# Patient Record
Sex: Female | Born: 1970 | Race: White | Hispanic: No | Marital: Married | Smoking: Never smoker
Health system: Southern US, Community
[De-identification: ages and names within clinical notes are randomized; demographics above are authoritative.]

## PROBLEM LIST (undated history)

## (undated) DIAGNOSIS — N6099 Unspecified benign mammary dysplasia of unspecified breast: Secondary | ICD-10-CM

## (undated) DIAGNOSIS — M1990 Unspecified osteoarthritis, unspecified site: Secondary | ICD-10-CM

## (undated) DIAGNOSIS — G44009 Cluster headache syndrome, unspecified, not intractable: Secondary | ICD-10-CM

## (undated) HISTORY — DX: Cluster headache syndrome, unspecified, not intractable: G44.009

## (undated) HISTORY — PX: KNEE ARTHROSCOPY: SUR90

## (undated) HISTORY — PX: RHINOPLASTY: SUR1284

---

## 1998-04-24 ENCOUNTER — Other Ambulatory Visit: Admission: RE | Admit: 1998-04-24 | Discharge: 1998-04-24 | Payer: Self-pay | Admitting: Obstetrics and Gynecology

## 1999-01-06 ENCOUNTER — Ambulatory Visit (HOSPITAL_COMMUNITY): Admission: RE | Admit: 1999-01-06 | Discharge: 1999-01-06 | Payer: Self-pay | Admitting: Obstetrics & Gynecology

## 1999-04-04 ENCOUNTER — Inpatient Hospital Stay (HOSPITAL_COMMUNITY): Admission: AD | Admit: 1999-04-04 | Discharge: 1999-04-06 | Payer: Self-pay | Admitting: Obstetrics and Gynecology

## 1999-05-15 ENCOUNTER — Other Ambulatory Visit: Admission: RE | Admit: 1999-05-15 | Discharge: 1999-05-15 | Payer: Self-pay | Admitting: Obstetrics and Gynecology

## 2000-08-02 ENCOUNTER — Other Ambulatory Visit: Admission: RE | Admit: 2000-08-02 | Discharge: 2000-08-02 | Payer: Self-pay | Admitting: Obstetrics and Gynecology

## 2001-06-19 ENCOUNTER — Encounter: Admission: RE | Admit: 2001-06-19 | Discharge: 2001-06-19 | Payer: Self-pay | Admitting: Family Medicine

## 2001-06-19 ENCOUNTER — Encounter: Payer: Self-pay | Admitting: Family Medicine

## 2001-09-07 ENCOUNTER — Other Ambulatory Visit: Admission: RE | Admit: 2001-09-07 | Discharge: 2001-09-07 | Payer: Self-pay | Admitting: Obstetrics and Gynecology

## 2002-10-03 ENCOUNTER — Other Ambulatory Visit: Admission: RE | Admit: 2002-10-03 | Discharge: 2002-10-03 | Payer: Self-pay | Admitting: Obstetrics and Gynecology

## 2003-03-01 ENCOUNTER — Encounter: Payer: Self-pay | Admitting: Family Medicine

## 2003-03-01 ENCOUNTER — Encounter: Admission: RE | Admit: 2003-03-01 | Discharge: 2003-03-01 | Payer: Self-pay | Admitting: Family Medicine

## 2003-11-07 ENCOUNTER — Other Ambulatory Visit: Admission: RE | Admit: 2003-11-07 | Discharge: 2003-11-07 | Payer: Self-pay | Admitting: Obstetrics and Gynecology

## 2005-05-28 ENCOUNTER — Other Ambulatory Visit: Admission: RE | Admit: 2005-05-28 | Discharge: 2005-05-28 | Payer: Self-pay | Admitting: Obstetrics and Gynecology

## 2007-08-29 ENCOUNTER — Other Ambulatory Visit: Admission: RE | Admit: 2007-08-29 | Discharge: 2007-08-29 | Payer: Self-pay | Admitting: Obstetrics and Gynecology

## 2009-09-09 ENCOUNTER — Other Ambulatory Visit: Admission: RE | Admit: 2009-09-09 | Discharge: 2009-09-09 | Payer: Self-pay | Admitting: Obstetrics and Gynecology

## 2010-03-10 ENCOUNTER — Encounter: Admission: RE | Admit: 2010-03-10 | Discharge: 2010-03-10 | Payer: Self-pay | Admitting: Family Medicine

## 2010-06-04 ENCOUNTER — Other Ambulatory Visit: Admission: RE | Admit: 2010-06-04 | Discharge: 2010-06-04 | Payer: Self-pay | Admitting: Obstetrics and Gynecology

## 2010-12-03 ENCOUNTER — Other Ambulatory Visit (HOSPITAL_COMMUNITY)
Admission: RE | Admit: 2010-12-03 | Discharge: 2010-12-03 | Disposition: A | Discharge status: Home / Self Care | Payer: PRIVATE HEALTH INSURANCE | Source: Ambulatory Visit | Attending: Obstetrics and Gynecology | Admitting: Obstetrics and Gynecology

## 2010-12-03 ENCOUNTER — Other Ambulatory Visit: Payer: Self-pay | Admitting: Nurse Practitioner

## 2010-12-03 DIAGNOSIS — Z01419 Encounter for gynecological examination (general) (routine) without abnormal findings: Secondary | ICD-10-CM | POA: Insufficient documentation

## 2011-12-16 ENCOUNTER — Other Ambulatory Visit: Payer: Self-pay | Admitting: Nurse Practitioner

## 2011-12-16 ENCOUNTER — Other Ambulatory Visit (HOSPITAL_COMMUNITY)
Admission: RE | Admit: 2011-12-16 | Discharge: 2011-12-16 | Disposition: A | Discharge status: Home / Self Care | Payer: PRIVATE HEALTH INSURANCE | Source: Ambulatory Visit | Attending: Obstetrics and Gynecology | Admitting: Obstetrics and Gynecology

## 2011-12-16 DIAGNOSIS — Z01419 Encounter for gynecological examination (general) (routine) without abnormal findings: Secondary | ICD-10-CM | POA: Insufficient documentation

## 2012-03-23 ENCOUNTER — Other Ambulatory Visit: Payer: Self-pay | Admitting: Family Medicine

## 2012-03-23 DIAGNOSIS — N644 Mastodynia: Secondary | ICD-10-CM

## 2012-04-03 ENCOUNTER — Other Ambulatory Visit: Payer: Self-pay | Admitting: Family Medicine

## 2012-04-03 ENCOUNTER — Ambulatory Visit
Admission: RE | Admit: 2012-04-03 | Discharge: 2012-04-03 | Disposition: A | Discharge status: Home / Self Care | Payer: PRIVATE HEALTH INSURANCE | Source: Ambulatory Visit | Attending: Family Medicine | Admitting: Family Medicine

## 2012-04-03 DIAGNOSIS — N644 Mastodynia: Secondary | ICD-10-CM

## 2012-06-19 HISTORY — PX: BREAST BIOPSY: SHX20

## 2012-07-02 HISTORY — PX: BREAST EXCISIONAL BIOPSY: SUR124

## 2013-01-18 ENCOUNTER — Other Ambulatory Visit: Payer: Self-pay

## 2013-01-18 DIAGNOSIS — Z1231 Encounter for screening mammogram for malignant neoplasm of breast: Secondary | ICD-10-CM

## 2013-02-01 ENCOUNTER — Other Ambulatory Visit: Payer: Self-pay | Admitting: Nurse Practitioner

## 2013-02-01 ENCOUNTER — Other Ambulatory Visit (HOSPITAL_COMMUNITY)
Admission: RE | Admit: 2013-02-01 | Discharge: 2013-02-01 | Disposition: A | Discharge status: Home / Self Care | Payer: BC Managed Care – PPO | Source: Ambulatory Visit | Attending: Obstetrics and Gynecology | Admitting: Obstetrics and Gynecology

## 2013-02-01 DIAGNOSIS — Z01419 Encounter for gynecological examination (general) (routine) without abnormal findings: Secondary | ICD-10-CM | POA: Insufficient documentation

## 2013-04-04 ENCOUNTER — Ambulatory Visit: Payer: PRIVATE HEALTH INSURANCE

## 2013-04-30 ENCOUNTER — Ambulatory Visit: Payer: PRIVATE HEALTH INSURANCE

## 2013-04-30 ENCOUNTER — Ambulatory Visit
Admission: RE | Admit: 2013-04-30 | Discharge: 2013-04-30 | Disposition: A | Discharge status: Home / Self Care | Payer: BC Managed Care – PPO | Source: Ambulatory Visit

## 2013-04-30 DIAGNOSIS — Z1231 Encounter for screening mammogram for malignant neoplasm of breast: Secondary | ICD-10-CM

## 2014-04-11 ENCOUNTER — Other Ambulatory Visit: Payer: Self-pay

## 2014-04-11 DIAGNOSIS — Z1231 Encounter for screening mammogram for malignant neoplasm of breast: Secondary | ICD-10-CM

## 2014-05-01 ENCOUNTER — Ambulatory Visit
Admission: RE | Admit: 2014-05-01 | Discharge: 2014-05-01 | Disposition: A | Discharge status: Home / Self Care | Payer: BC Managed Care – PPO | Source: Ambulatory Visit

## 2014-05-01 DIAGNOSIS — Z1231 Encounter for screening mammogram for malignant neoplasm of breast: Secondary | ICD-10-CM

## 2015-05-28 ENCOUNTER — Other Ambulatory Visit: Payer: Self-pay

## 2015-05-28 DIAGNOSIS — Z1231 Encounter for screening mammogram for malignant neoplasm of breast: Secondary | ICD-10-CM

## 2015-06-02 ENCOUNTER — Ambulatory Visit
Admission: RE | Admit: 2015-06-02 | Discharge: 2015-06-02 | Disposition: A | Discharge status: Home / Self Care | Payer: BLUE CROSS/BLUE SHIELD | Source: Ambulatory Visit

## 2015-06-02 DIAGNOSIS — Z1231 Encounter for screening mammogram for malignant neoplasm of breast: Secondary | ICD-10-CM

## 2015-06-03 ENCOUNTER — Other Ambulatory Visit: Payer: Self-pay | Admitting: Family Medicine

## 2015-06-03 DIAGNOSIS — R928 Other abnormal and inconclusive findings on diagnostic imaging of breast: Secondary | ICD-10-CM

## 2015-06-06 ENCOUNTER — Other Ambulatory Visit: Payer: Self-pay | Admitting: Family Medicine

## 2015-06-06 ENCOUNTER — Ambulatory Visit
Admission: RE | Admit: 2015-06-06 | Discharge: 2015-06-06 | Disposition: A | Discharge status: Home / Self Care | Payer: BLUE CROSS/BLUE SHIELD | Source: Ambulatory Visit | Attending: Family Medicine | Admitting: Family Medicine

## 2015-06-06 DIAGNOSIS — R928 Other abnormal and inconclusive findings on diagnostic imaging of breast: Secondary | ICD-10-CM

## 2015-06-06 DIAGNOSIS — R921 Mammographic calcification found on diagnostic imaging of breast: Secondary | ICD-10-CM

## 2015-06-10 ENCOUNTER — Other Ambulatory Visit: Payer: Self-pay | Admitting: Family Medicine

## 2015-06-10 ENCOUNTER — Ambulatory Visit
Admission: RE | Admit: 2015-06-10 | Discharge: 2015-06-10 | Disposition: A | Discharge status: Home / Self Care | Payer: BLUE CROSS/BLUE SHIELD | Source: Ambulatory Visit | Attending: Family Medicine | Admitting: Family Medicine

## 2015-06-10 DIAGNOSIS — R921 Mammographic calcification found on diagnostic imaging of breast: Secondary | ICD-10-CM

## 2015-06-27 ENCOUNTER — Other Ambulatory Visit: Payer: Self-pay | Admitting: General Surgery

## 2015-06-27 DIAGNOSIS — N6091 Unspecified benign mammary dysplasia of right breast: Secondary | ICD-10-CM

## 2015-07-01 ENCOUNTER — Other Ambulatory Visit: Payer: Self-pay | Admitting: General Surgery

## 2015-07-01 DIAGNOSIS — N6091 Unspecified benign mammary dysplasia of right breast: Secondary | ICD-10-CM

## 2015-07-09 ENCOUNTER — Encounter (HOSPITAL_BASED_OUTPATIENT_CLINIC_OR_DEPARTMENT_OTHER): Payer: Self-pay | Admitting: *Deleted

## 2015-07-14 ENCOUNTER — Ambulatory Visit
Admission: RE | Admit: 2015-07-14 | Discharge: 2015-07-14 | Disposition: A | Discharge status: Home / Self Care | Payer: BLUE CROSS/BLUE SHIELD | Source: Ambulatory Visit | Attending: General Surgery | Admitting: General Surgery

## 2015-07-14 DIAGNOSIS — N6091 Unspecified benign mammary dysplasia of right breast: Secondary | ICD-10-CM

## 2015-07-14 NOTE — H&P (Signed)
Alexandra Carlson  Location: [facility] Patient #: [id number] DOB: Jul 11, 1971 Married / Language: English / Race: White Female        History of Present Illness   The patient is a 44 year old female who presents with a breast mass. 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 Dr. Marisue Humble is her PCP. She is referred by Dr. Luberta Robertson at the breast center [facility] for evaluation and management of an area of atypical ductal hyperplasia right breast, 8 o'clock position. She began getting screening mammograms at age 37. Recent screening mammogram show an area of clustered calcifications in the right breast, lower outer quadrant, 8 o'clock position, mid to posterior depth, 1.5 cm diameter. 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 This was read out by Dr. Claudette Laws. I have reviewed the imaging studies and the pathology report to her. I have advised to have this area conservatively excised and she completely agrees. Family history is negative for breast or ovarian cancer. Mother has hyperlipidemia and hypertension. Father has congestive heart failure. Patient is married with one child. 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 Denies tobacco.  I discussed the indications, details, techniques, and numerous risk of right breast lumpectomy with radioactive seed localization. I told her that would treat this like a in situ cancer and try to get a small margin. She is aware the risk of bleeding, infection, nerve damage with chronic pain or numbness, cosmetic deformity, reoperation for complications or for positive margins, and other unforeseen problems. She knows that if this turns out to be cancer that further treatment options will need to be explored. She understands all these issues. All of her questions are answered. She agrees with this  plan.   Other Problems Arthritis General anesthesia - complications Migraine Headache  Past Surgical History  Knee Surgery Right. Oral Surgery  Diagnostic Studies History  Colonoscopy never Mammogram within last year Pap Smear 1-5 years ago  Allergies No Known Drug Allergies08/26/2016  Medication History  Enbrel SureClick (50MG /ML Soln Auto-inj, Subcutaneous) Active. Norgestim-Eth Estrad Triphasic (0.18/0.215/0.25MG -35 MCG Tablet, Oral) Active. Mobic (15MG  Tablet, Oral) Active. Medications Reconciled  Social History  Caffeine use Carbonated beverages, Tea. No alcohol use No drug use Tobacco use Never smoker.  Family History  Heart Disease Brother, Father. Heart disease in female family member before age 40 Heart disease in female family member before age 58 Hypertension Mother. Ischemic Bowel Disease Mother. Kidney Disease Father. Migraine Headache Mother. Seizure disorder Brother.  Pregnancy / Birth History  Age at menarche 33 years. Contraceptive History Oral contraceptives. Gravida 2 Maternal age 38-30 Para 1 Regular periods  Review of Systems  General Not Present- Appetite Loss, Chills, Fatigue, Fever, Night Sweats, Weight Gain and Weight Loss. HEENT Not Present- Earache, Hearing Loss, Hoarseness, Nose Bleed, Oral Ulcers, Ringing in the Ears, Seasonal Allergies, Sinus Pain, Sore Throat, Visual Disturbances, Wears glasses/contact lenses and Yellow Eyes. Respiratory Not Present- Bloody sputum, Chronic Cough, Difficulty Breathing, Snoring and Wheezing. Breast Present- Breast Mass and Nipple Discharge. Not Present- Breast Pain and Skin Changes. Cardiovascular Not Present- Chest Pain, Difficulty Breathing Lying Down, Leg Cramps, Palpitations, Rapid Heart Rate, Shortness of Breath and Swelling of Extremities. Gastrointestinal Not Present- Abdominal Pain, Bloating, Bloody Stool, Change in Bowel Habits, Chronic diarrhea, Constipation,  Difficulty Swallowing, Excessive gas, Gets full quickly at meals, Hemorrhoids, Indigestion, Nausea, Rectal Pain and Vomiting. Female Genitourinary Not  Present- Frequency, Nocturia, Painful Urination, Pelvic Pain and Urgency. Musculoskeletal Present- Joint Pain. Not Present- Back Pain, Joint Stiffness, Muscle Pain, Muscle Weakness and Swelling of Extremities. Neurological Not Present- Decreased Memory, Fainting, Headaches, Numbness, Seizures, Tingling, Tremor, Trouble walking and Weakness. Psychiatric Not Present- Anxiety, Bipolar, Change in Sleep Pattern, Depression, Fearful and Frequent crying. Endocrine Not Present- Cold Intolerance, Excessive Hunger, Hair Changes, Heat Intolerance, Hot flashes and New Diabetes. Hematology Not Present- Easy Bruising, Excessive bleeding, Gland problems, HIV and Persistent Infections.   Vitals  Weight: 170 lb Height: 66in Body Surface Area: 1.89 m Body Mass Index: 27.44 kg/m Temp.: 45F(Temporal)  Pulse: 109 (Regular)  BP: 124/80 (Sitting, Left Arm, Standard)    Physical Exam  General Mental Status-Alert. General Appearance-Not in acute distress. Build & Nutrition-Well nourished. Posture-Normal posture. Gait-Normal.  Head and Neck Head-normocephalic, atraumatic with no lesions or palpable masses. Trachea-midline. Thyroid Gland Characteristics - normal size and consistency and no palpable nodules.  Chest and Lung Exam Chest and lung exam reveals -on auscultation, normal breath sounds, no adventitious sounds and normal vocal resonance.  Breast Note: Recent biopsy puncture site right breast at 6:00. No hematoma. No palpable mass in either breast. Nipple and areola complex is normal. No axillary adenopathy. Left breast exam is unremarkable.   Cardiovascular Cardiovascular examination reveals -normal heart sounds, regular rate and rhythm with no murmurs and femoral artery auscultation bilaterally reveals normal  pulses, no bruits, no thrills.  Abdomen Inspection Inspection of the abdomen reveals - No Hernias. Palpation/Percussion Palpation and Percussion of the abdomen reveal - Soft, Non Tender, No Rigidity (guarding), No hepatosplenomegaly and No Palpable abdominal masses.  Neurologic Neurologic evaluation reveals -alert and oriented x 3 with no impairment of recent or remote memory, normal attention span and ability to concentrate, normal sensation and normal coordination.  Musculoskeletal Normal Exam - Bilateral-Upper Extremity Strength Normal and Lower Extremity Strength Normal.    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 The pathologist says that he is concerned there may be borderline ductal carcinoma in situ. This area should be conservatively excised to clarify your diagnosis. I have discussed the indications, techniques, and numerous risk of right breast lumpectomy with radioactive seed localization Please read the printed information that I gave you We will schedule the surgery for you in the near future.  Pt Education - [facility] Breast Biopsy HCI: discussed with patient and provided information. Pt Education - [facility] Breast Biopsy Instructions (FLB): discussed with patient and provided information.   PSORIATIC ARTHRITIS (696.0  L40.50)   Edsel Petrin. Dalbert Batman, M.D., [facility] General and Minimally invasive Surgery Breast and Colorectal Surgery Office:   [phone] Pager:   [phone]

## 2015-07-15 ENCOUNTER — Ambulatory Visit (HOSPITAL_BASED_OUTPATIENT_CLINIC_OR_DEPARTMENT_OTHER): Payer: BLUE CROSS/BLUE SHIELD | Admitting: Anesthesiology

## 2015-07-15 ENCOUNTER — Encounter (HOSPITAL_BASED_OUTPATIENT_CLINIC_OR_DEPARTMENT_OTHER): Payer: Self-pay | Admitting: Anesthesiology

## 2015-07-15 ENCOUNTER — Ambulatory Visit (HOSPITAL_BASED_OUTPATIENT_CLINIC_OR_DEPARTMENT_OTHER)
Admission: RE | Admit: 2015-07-15 | Discharge: 2015-07-15 | Disposition: A | Discharge status: Home / Self Care | Payer: BLUE CROSS/BLUE SHIELD | Source: Ambulatory Visit | Attending: General Surgery | Admitting: General Surgery

## 2015-07-15 ENCOUNTER — Ambulatory Visit
Admission: RE | Admit: 2015-07-15 | Discharge: 2015-07-15 | Disposition: A | Discharge status: Home / Self Care | Payer: BLUE CROSS/BLUE SHIELD | Source: Ambulatory Visit | Attending: General Surgery | Admitting: General Surgery

## 2015-07-15 ENCOUNTER — Encounter (HOSPITAL_BASED_OUTPATIENT_CLINIC_OR_DEPARTMENT_OTHER)
Admission: RE | Disposition: A | Discharge status: Home / Self Care | Payer: Self-pay | Source: Ambulatory Visit | Attending: General Surgery

## 2015-07-15 DIAGNOSIS — M1990 Unspecified osteoarthritis, unspecified site: Secondary | ICD-10-CM | POA: Diagnosis not present

## 2015-07-15 DIAGNOSIS — D241 Benign neoplasm of right breast: Secondary | ICD-10-CM | POA: Insufficient documentation

## 2015-07-15 DIAGNOSIS — N6091 Unspecified benign mammary dysplasia of right breast: Secondary | ICD-10-CM | POA: Insufficient documentation

## 2015-07-15 DIAGNOSIS — N6011 Diffuse cystic mastopathy of right breast: Secondary | ICD-10-CM | POA: Diagnosis not present

## 2015-07-15 DIAGNOSIS — Z885 Allergy status to narcotic agent status: Secondary | ICD-10-CM | POA: Insufficient documentation

## 2015-07-15 DIAGNOSIS — L4050 Arthropathic psoriasis, unspecified: Secondary | ICD-10-CM | POA: Insufficient documentation

## 2015-07-15 HISTORY — DX: Unspecified osteoarthritis, unspecified site: M19.90

## 2015-07-15 HISTORY — PX: BREAST LUMPECTOMY WITH RADIOACTIVE SEED LOCALIZATION: SHX6424

## 2015-07-15 HISTORY — DX: Unspecified benign mammary dysplasia of unspecified breast: N60.99

## 2015-07-15 LAB — POCT HEMOGLOBIN-HEMACUE: HEMOGLOBIN: 15 g/dL (ref 12.0–15.0)

## 2015-07-15 SURGERY — BREAST LUMPECTOMY WITH RADIOACTIVE SEED LOCALIZATION
Anesthesia: General | Site: Breast | Laterality: Right

## 2015-07-15 MED ORDER — SCOPOLAMINE 1 MG/3DAYS TD PT72
MEDICATED_PATCH | TRANSDERMAL | Status: AC
  Filled 2015-07-15: qty 1

## 2015-07-15 MED ORDER — CEFAZOLIN SODIUM-DEXTROSE 2-3 GM-% IV SOLR
2.0000 g | INTRAVENOUS | Status: AC
  Administered 2015-07-15: 2 g via INTRAVENOUS

## 2015-07-15 MED ORDER — MEPERIDINE HCL 25 MG/ML IJ SOLN: 6.2500 mg | INTRAMUSCULAR | Status: DC | PRN

## 2015-07-15 MED ORDER — SCOPOLAMINE 1 MG/3DAYS TD PT72
1.0000 | MEDICATED_PATCH | Freq: Once | TRANSDERMAL | Status: DC | PRN
  Administered 2015-07-15: 1.5 mg via TRANSDERMAL

## 2015-07-15 MED ORDER — HYDROMORPHONE HCL 1 MG/ML IJ SOLN: 0.2500 mg | INTRAMUSCULAR | Status: DC | PRN

## 2015-07-15 MED ORDER — CEFAZOLIN SODIUM-DEXTROSE 2-3 GM-% IV SOLR
INTRAVENOUS | Status: AC
  Filled 2015-07-15: qty 50

## 2015-07-15 MED ORDER — BUPIVACAINE-EPINEPHRINE (PF) 0.5% -1:200000 IJ SOLN
INTRAMUSCULAR | Status: DC | PRN
  Administered 2015-07-15: 8 mL
  Administered 2015-07-15: 9 mL

## 2015-07-15 MED ORDER — OXYCODONE HCL 5 MG/5ML PO SOLN: 5.0000 mg | Freq: Once | ORAL | Status: DC | PRN

## 2015-07-15 MED ORDER — DEXAMETHASONE SODIUM PHOSPHATE 4 MG/ML IJ SOLN
INTRAMUSCULAR | Status: DC | PRN
  Administered 2015-07-15: 10 mg via INTRAVENOUS

## 2015-07-15 MED ORDER — PROPOFOL 10 MG/ML IV BOLUS
INTRAVENOUS | Status: AC
  Filled 2015-07-15: qty 20

## 2015-07-15 MED ORDER — OXYCODONE HCL 5 MG PO TABS: 5.0000 mg | ORAL_TABLET | Freq: Once | ORAL | Status: DC | PRN

## 2015-07-15 MED ORDER — HYDROCODONE-ACETAMINOPHEN 5-325 MG PO TABS: 1.0000 | ORAL_TABLET | Freq: Four times a day (QID) | ORAL | Status: DC | PRN

## 2015-07-15 MED ORDER — MIDAZOLAM HCL 5 MG/5ML IJ SOLN
INTRAMUSCULAR | Status: DC | PRN
  Administered 2015-07-15: 2 mg via INTRAVENOUS

## 2015-07-15 MED ORDER — FENTANYL CITRATE (PF) 100 MCG/2ML IJ SOLN
INTRAMUSCULAR | Status: AC
  Filled 2015-07-15: qty 4

## 2015-07-15 MED ORDER — ONDANSETRON HCL 4 MG/2ML IJ SOLN
INTRAMUSCULAR | Status: AC
  Filled 2015-07-15: qty 2

## 2015-07-15 MED ORDER — PROPOFOL 10 MG/ML IV BOLUS
INTRAVENOUS | Status: DC | PRN
  Administered 2015-07-15 (×2): 30 mg via INTRAVENOUS
  Administered 2015-07-15: 140 mg via INTRAVENOUS

## 2015-07-15 MED ORDER — LACTATED RINGERS IV SOLN
INTRAVENOUS | Status: DC
  Administered 2015-07-15 (×2): via INTRAVENOUS

## 2015-07-15 MED ORDER — LIDOCAINE HCL (CARDIAC) 20 MG/ML IV SOLN
INTRAVENOUS | Status: DC | PRN
  Administered 2015-07-15: 50 mg via INTRAVENOUS

## 2015-07-15 MED ORDER — FENTANYL CITRATE (PF) 100 MCG/2ML IJ SOLN
INTRAMUSCULAR | Status: DC | PRN
  Administered 2015-07-15: 50 ug via INTRAVENOUS
  Administered 2015-07-15: 100 ug via INTRAVENOUS

## 2015-07-15 MED ORDER — LIDOCAINE HCL (CARDIAC) 20 MG/ML IV SOLN
INTRAVENOUS | Status: AC
  Filled 2015-07-15: qty 5

## 2015-07-15 MED ORDER — FENTANYL CITRATE (PF) 100 MCG/2ML IJ SOLN: 50.0000 ug | INTRAMUSCULAR | Status: DC | PRN

## 2015-07-15 MED ORDER — GLYCOPYRROLATE 0.2 MG/ML IJ SOLN: 0.2000 mg | Freq: Once | INTRAMUSCULAR | Status: DC | PRN

## 2015-07-15 MED ORDER — MIDAZOLAM HCL 2 MG/2ML IJ SOLN
INTRAMUSCULAR | Status: AC
  Filled 2015-07-15: qty 4

## 2015-07-15 MED ORDER — ONDANSETRON HCL 4 MG/2ML IJ SOLN
INTRAMUSCULAR | Status: DC | PRN
  Administered 2015-07-15: 4 mg via INTRAVENOUS

## 2015-07-15 MED ORDER — CHLORHEXIDINE GLUCONATE 4 % EX LIQD: 1.0000 "application " | Freq: Once | CUTANEOUS | Status: DC

## 2015-07-15 MED ORDER — MIDAZOLAM HCL 2 MG/2ML IJ SOLN: 1.0000 mg | INTRAMUSCULAR | Status: DC | PRN

## 2015-07-15 MED ORDER — DEXAMETHASONE SODIUM PHOSPHATE 10 MG/ML IJ SOLN
INTRAMUSCULAR | Status: AC
  Filled 2015-07-15: qty 1

## 2015-07-15 SURGICAL SUPPLY — 65 items
ADH SKN CLS APL DERMABOND .7 (GAUZE/BANDAGES/DRESSINGS) ×1
APL SKNCLS STERI-STRIP NONHPOA (GAUZE/BANDAGES/DRESSINGS)
APPLIER CLIP 9.375 MED OPEN (MISCELLANEOUS)
APR CLP MED 9.3 20 MLT OPN (MISCELLANEOUS)
BENZOIN TINCTURE PRP APPL 2/3 (GAUZE/BANDAGES/DRESSINGS) IMPLANT
BINDER BREAST LRG (GAUZE/BANDAGES/DRESSINGS) IMPLANT
BINDER BREAST MEDIUM (GAUZE/BANDAGES/DRESSINGS) IMPLANT
BINDER BREAST XLRG (GAUZE/BANDAGES/DRESSINGS) IMPLANT
BINDER BREAST XXLRG (GAUZE/BANDAGES/DRESSINGS) IMPLANT
BLADE HEX COATED 2.75 (ELECTRODE) ×3 IMPLANT
BLADE SURG 10 STRL SS (BLADE) IMPLANT
BLADE SURG 15 STRL LF DISP TIS (BLADE) ×1 IMPLANT
BLADE SURG 15 STRL SS (BLADE) ×3
CANISTER SUC SOCK COL 7IN (MISCELLANEOUS) IMPLANT
CANISTER SUCT 1200ML W/VALVE (MISCELLANEOUS) ×3 IMPLANT
CHLORAPREP W/TINT 26ML (MISCELLANEOUS) ×3 IMPLANT
CLIP APPLIE 9.375 MED OPEN (MISCELLANEOUS) IMPLANT
CLOSURE WOUND 1/2 X4 (GAUZE/BANDAGES/DRESSINGS)
COVER BACK TABLE 60X90IN (DRAPES) ×3 IMPLANT
COVER MAYO STAND STRL (DRAPES) ×3 IMPLANT
COVER PROBE W GEL 5X96 (DRAPES) ×3 IMPLANT
DECANTER SPIKE VIAL GLASS SM (MISCELLANEOUS) IMPLANT
DERMABOND ADVANCED (GAUZE/BANDAGES/DRESSINGS) ×2
DERMABOND ADVANCED .7 DNX12 (GAUZE/BANDAGES/DRESSINGS) ×1 IMPLANT
DEVICE DUBIN W/COMP PLATE 8390 (MISCELLANEOUS) ×3 IMPLANT
DRAPE LAPAROSCOPIC ABDOMINAL (DRAPES) ×3 IMPLANT
DRAPE UTILITY XL STRL (DRAPES) ×3 IMPLANT
DRSG PAD ABDOMINAL 8X10 ST (GAUZE/BANDAGES/DRESSINGS) IMPLANT
ELECT REM PT RETURN 9FT ADLT (ELECTROSURGICAL) ×3
ELECTRODE REM PT RTRN 9FT ADLT (ELECTROSURGICAL) ×1 IMPLANT
GLOVE BIO SURGEON STRL SZ 6.5 (GLOVE) ×1 IMPLANT
GLOVE BIO SURGEONS STRL SZ 6.5 (GLOVE) ×1
GLOVE BIOGEL PI IND STRL 7.0 (GLOVE) IMPLANT
GLOVE BIOGEL PI INDICATOR 7.0 (GLOVE) ×4
GLOVE EUDERMIC 7 POWDERFREE (GLOVE) ×5 IMPLANT
GOWN STRL REUS W/ TWL LRG LVL3 (GOWN DISPOSABLE) ×1 IMPLANT
GOWN STRL REUS W/ TWL XL LVL3 (GOWN DISPOSABLE) ×1 IMPLANT
GOWN STRL REUS W/TWL LRG LVL3 (GOWN DISPOSABLE) ×3
GOWN STRL REUS W/TWL XL LVL3 (GOWN DISPOSABLE) ×3
KIT MARKER MARGIN INK (KITS) ×3 IMPLANT
NDL HYPO 25X1 1.5 SAFETY (NEEDLE) ×1 IMPLANT
NEEDLE HYPO 25X1 1.5 SAFETY (NEEDLE) ×3 IMPLANT
NS IRRIG 1000ML POUR BTL (IV SOLUTION) ×3 IMPLANT
PACK BASIN DAY SURGERY FS (CUSTOM PROCEDURE TRAY) ×3 IMPLANT
PENCIL BUTTON HOLSTER BLD 10FT (ELECTRODE) ×3 IMPLANT
SHEET MEDIUM DRAPE 40X70 STRL (DRAPES) ×2 IMPLANT
SLEEVE SCD COMPRESS KNEE MED (MISCELLANEOUS) ×3 IMPLANT
SPONGE GAUZE 4X4 12PLY STER LF (GAUZE/BANDAGES/DRESSINGS) IMPLANT
SPONGE LAP 18X18 X RAY DECT (DISPOSABLE) IMPLANT
SPONGE LAP 4X18 X RAY DECT (DISPOSABLE) ×3 IMPLANT
STRIP CLOSURE SKIN 1/2X4 (GAUZE/BANDAGES/DRESSINGS) IMPLANT
SUT ETHILON 3 0 FSL (SUTURE) IMPLANT
SUT MNCRL AB 4-0 PS2 18 (SUTURE) ×3 IMPLANT
SUT SILK 2 0 SH (SUTURE) ×3 IMPLANT
SUT VIC AB 2-0 CT1 27 (SUTURE)
SUT VIC AB 2-0 CT1 TAPERPNT 27 (SUTURE) IMPLANT
SUT VIC AB 3-0 SH 27 (SUTURE)
SUT VIC AB 3-0 SH 27X BRD (SUTURE) IMPLANT
SUT VICRYL 3-0 CR8 SH (SUTURE) ×3 IMPLANT
SYRINGE 10CC LL (SYRINGE) ×3 IMPLANT
TOWEL OR 17X24 6PK STRL BLUE (TOWEL DISPOSABLE) ×3 IMPLANT
TOWEL OR NON WOVEN STRL DISP B (DISPOSABLE) IMPLANT
TUBE CONNECTING 20'X1/4 (TUBING) ×1
TUBE CONNECTING 20X1/4 (TUBING) ×2 IMPLANT
YANKAUER SUCT BULB TIP NO VENT (SUCTIONS) ×3 IMPLANT

## 2015-07-15 NOTE — Anesthesia Procedure Notes (Signed)
Procedure Name: LMA Insertion Date/Time: 07/15/2015 10:03 AM Performed by: Toula Moos L Pre-anesthesia Checklist: Patient identified, Emergency Drugs available, Suction available, Patient being monitored and Timeout performed Patient Re-evaluated:Patient Re-evaluated prior to inductionOxygen Delivery Method: Circle System Utilized Preoxygenation: Pre-oxygenation with 100% oxygen Intubation Type: IV induction Ventilation: Mask ventilation without difficulty LMA: LMA inserted LMA Size: 4.0 Number of attempts: 1 Airway Equipment and Method: Bite block Placement Confirmation: positive ETCO2 Tube secured with: Tape Dental Injury: Teeth and Oropharynx as per pre-operative assessment

## 2015-07-15 NOTE — Anesthesia Postprocedure Evaluation (Signed)
Anesthesia Post Note  Patient: Alexandra Carlson  Procedure(s) Performed: Procedure(s) (LRB): BREAST LUMPECTOMY WITH RADIOACTIVE SEED LOCALIZATION (Right)  Anesthesia type: general  Patient location: PACU  Post pain: Pain level controlled  Post assessment: Patient's Cardiovascular Status Stable  Last Vitals:  Filed Vitals:   07/15/15 1200  BP: 124/69  Pulse: 87  Temp:   Resp: 15    Post vital signs: Reviewed and stable  Level of consciousness: sedated  Complications: No apparent anesthesia complications

## 2015-07-15 NOTE — Transfer of Care (Signed)
Immediate Anesthesia Transfer of Care Note  Patient: Alexandra Carlson  Procedure(s) Performed: Procedure(s): BREAST LUMPECTOMY WITH RADIOACTIVE SEED LOCALIZATION (Right)  Patient Location: PACU  Anesthesia Type:General  Level of Consciousness: sedated  Airway & Oxygen Therapy: Patient Spontanous Breathing and Patient connected to face mask oxygen  Post-op Assessment: Report given to RN and Post -op Vital signs reviewed and stable  Post vital signs: Reviewed and stable  Last Vitals:  Filed Vitals:   07/15/15 0804  BP: 126/90  Pulse: 98  Temp: 36.7 C  Resp: 20    Complications: No apparent anesthesia complications

## 2015-07-15 NOTE — Discharge Instructions (Signed)
[facility] °Office Phone Number [phone] ° °BREAST BIOPSY/ PARTIAL MASTECTOMY: POST OP INSTRUCTIONS ° °Always review your discharge instruction sheet given to you by the facility where your surgery was performed. ° °IF YOU HAVE DISABILITY OR FAMILY LEAVE FORMS, YOU MUST BRING THEM TO THE OFFICE FOR PROCESSING.  DO NOT GIVE THEM TO YOUR DOCTOR. ° °1. A prescription for pain medication may be given to you upon discharge.  Take your pain medication as prescribed, if needed.  If narcotic pain medicine is not needed, then you may take acetaminophen (Tylenol) or ibuprofen (Advil) as needed. °2. Take your usually prescribed medications unless otherwise directed °3. If you need a refill on your pain medication, please contact your pharmacy.  They will contact our office to request authorization.  Prescriptions will not be filled after 5pm or on week-ends. °4. You should eat very light the first 24 hours after surgery, such as soup, crackers, pudding, etc.  Resume your normal diet the day after surgery. °5. Most patients will experience some swelling and bruising in the breast.  Ice packs and a good support bra will help.  Swelling and bruising can take several days to resolve.  °6. It is common to experience some constipation if taking pain medication after surgery.  Increasing fluid intake and taking a stool softener will usually help or prevent this problem from occurring.  A mild laxative (Milk of Magnesia or Miralax) should be taken according to package directions if there are no bowel movements after 48 hours. °7. Unless discharge instructions indicate otherwise, you may remove your bandages 24-48 hours after surgery, and you may shower at that time.  You may have steri-strips (small skin tapes) in place directly over the incision.  These strips should be left on the skin for 7-10 days.  If your surgeon used skin glue on the incision, you may shower in 24 hours.  The glue will flake off over the  next 2-3 weeks.  Any sutures or staples will be removed at the office during your follow-up visit. °8. ACTIVITIES:  You may resume regular daily activities (gradually increasing) beginning the next day.  Wearing a good support bra or sports bra minimizes pain and swelling.  You may have sexual intercourse when it is comfortable. °a. You may drive when you no longer are taking prescription pain medication, you can comfortably wear a seatbelt, and you can safely maneuver your car and apply brakes. °b. RETURN TO WORK:  ______________________________________________________________________________________ °9. You should see your doctor in the office for a follow-up appointment approximately two weeks after your surgery.  Your doctor’s nurse will typically make your follow-up appointment when she calls you with your pathology report.  Expect your pathology report 2-3 business days after your surgery.  You may call to check if you do not hear from us after three days. °10. OTHER INSTRUCTIONS: _______________________________________________________________________________________________ _____________________________________________________________________________________________________________________________________ °_____________________________________________________________________________________________________________________________________ °_____________________________________________________________________________________________________________________________________ ° °WHEN TO CALL YOUR DOCTOR: °1. Fever over 101.0 °2. Nausea and/or vomiting. °3. Extreme swelling or bruising. °4. Continued bleeding from incision. °5. Increased pain, redness, or drainage from the incision. ° °The clinic staff is available to answer your questions during regular business hours.  Please don’t hesitate to call and ask to speak to one of the nurses for clinical concerns.  If you have a medical emergency, go to the nearest  emergency room or call 911.  A surgeon from [facility] is always on call at the hospital. ° °For further questions, please visit [email or web address]  ° ° ° °  Post Anesthesia Home Care Instructions ° °Activity: °Get plenty of rest for the remainder of the day. A responsible adult should stay with you for 24 hours following the procedure.  °For the next 24 hours, DO NOT: °-Drive a car °-Operate machinery °-Drink alcoholic beverages °-Take any medication unless instructed by your physician °-Make any legal decisions or sign important papers. ° °Meals: °Start with liquid foods such as gelatin or soup. Progress to regular foods as tolerated. Avoid greasy, spicy, heavy foods. If nausea and/or vomiting occur, drink only clear liquids until the nausea and/or vomiting subsides. Call your physician if vomiting continues. ° °Special Instructions/Symptoms: °Your throat may feel dry or sore from the anesthesia or the breathing tube placed in your throat during surgery. If this causes discomfort, gargle with warm salt water. The discomfort should disappear within 24 hours. ° °If you had a scopolamine patch placed behind your ear for the management of post- operative nausea and/or vomiting: ° °1. The medication in the patch is effective for 72 hours, after which it should be removed.  Wrap patch in a tissue and discard in the trash. Wash hands thoroughly with soap and water. °2. You may remove the patch earlier than 72 hours if you experience unpleasant side effects which may include dry mouth, dizziness or visual disturbances. °3. Avoid touching the patch. Wash your hands with soap and water after contact with the patch. °  ° °

## 2015-07-15 NOTE — Op Note (Signed)
Patient Name:           Alexandra Carlson   Date of Surgery:        07/15/2015  Pre op Diagnosis:      Atypical ductal hyperplasia right breast, 8:00 position  Post op Diagnosis:    Same  Procedure:                 Right partial mastectomy with radioactive seed localization  Surgeon:                     Edsel Petrin. 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 Dr. Marisue Humble is her PCP. She is referred by Dr. Luberta Robertson at the breast center [facility] for evaluation and management of an area of atypical ductal hyperplasia right breast, 8 o'clock position. She began getting screening mammograms at age 89. Recent screening mammogram show an area of clustered calcifications in the right breast, lower outer quadrant, 8 o'clock position, mid to posterior depth, 1.5 cm diameter. 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 This was read out by Dr. Claudette Laws. I have reviewed the imaging studies and the pathology report to her. I have advised to have this area conservatively excised and she completely agrees. Family history is negative for breast or ovarian cancer.   She knows that if this turns out to be cancer that further treatment options will need to be explored. She understands all these issues. All of her questions are answered. She agrees with this plan.  Operative Findings:       The marker clip and the radioactive seed were found in the right breast at about the 6:30 position.  The specimen mammogram looked very good with radioactive seed in the marker clip in the center of the specimen.  There was no palpable abnormality.  Procedure in Detail:          Patient underwent radioactive seed placement earlier this week.  I could hear the radioactive signal with the neoprobe  in the holding area.  The patient was taken to the operating room and underwent general anesthesia with LMA device.  Surgical timeout was performed.  The right breast was prepped and draped in a sterile fashion.  0.5% Marcaine with epinephrine was used as local infiltration is that it.  Intravenous antibiotics were given.     With the neoprobe I could identify the area of maximum radioactivity which was just slightly to the right of the 6:00 position.  This was low in the breast and so I made a radially oriented incision at the 6:00 position.  Using the neoprobe as a guide I dissected down into the breast tissue and around the radioactive signal.  Specimen mammogram looked very good.  Specimen was marked with silk sutures and a 6 color ink kit to orient the pathologist.  The specimen was sent to pathology.  Hemostasis excellent.  Wound irrigated.  Deeper breast tissues and superficial breast tissues closed with interrupted 3-0 Vicryl and skin closed with running subcuticular 4-0 Monocryl and Dermabond.  Breast binder was placed in the patient taken to PACU in stable condition.  EBL 10 mL.  Counts correct.  Complications none.  Edsel Petrin. Dalbert Batman, M.D., FACS General and Minimally Invasive Surgery Breast and Colorectal Surgery  07/15/2015 11:09 AM

## 2015-07-15 NOTE — Interval H&P Note (Signed)
History and Physical Interval Note:  07/15/2015 9:28 AM  Alexandra Carlson  has presented today for surgery, with the diagnosis of atypical ductal hyperplasia right breast  The various methods of treatment have been discussed with the patient and family. After consideration of risks, benefits and other options for treatment, the patient has consented to  Procedure(s): BREAST LUMPECTOMY WITH RADIOACTIVE SEED LOCALIZATION (Right) as a surgical intervention .  The patient's history has been reviewed, patient examined, no change in status, stable for surgery.  I have reviewed the patient's chart and labs.  Questions were answered to the patient's satisfaction.     Adin Hector

## 2015-07-15 NOTE — Anesthesia Preprocedure Evaluation (Signed)
Anesthesia Evaluation  Patient identified by MRN, date of birth, ID band Patient awake    Reviewed: Allergy & Precautions, NPO status , Patient's Chart, lab work & pertinent test results  Airway Mallampati: I  TM Distance: >3 FB Neck ROM: Full    Dental  (+) Teeth Intact, Dental Advisory Given   Pulmonary  breath sounds clear to auscultation        Cardiovascular Rhythm:Regular Rate:Normal     Neuro/Psych    GI/Hepatic   Endo/Other    Renal/GU      Musculoskeletal   Abdominal   Peds  Hematology   Anesthesia Other Findings   Reproductive/Obstetrics                             Anesthesia Physical Anesthesia Plan  ASA: II  Anesthesia Plan: General   Post-op Pain Management:    Induction: Intravenous  Airway Management Planned: LMA  Additional Equipment:   Intra-op Plan:   Post-operative Plan: Extubation in OR  Informed Consent: I have reviewed the patients History and Physical, chart, labs and discussed the procedure including the risks, benefits and alternatives for the proposed anesthesia with the patient or authorized representative who has indicated his/her understanding and acceptance.   Dental advisory given  Plan Discussed with: CRNA, Anesthesiologist and Surgeon  Anesthesia Plan Comments:         Anesthesia Quick Evaluation  

## 2015-07-16 ENCOUNTER — Encounter (HOSPITAL_BASED_OUTPATIENT_CLINIC_OR_DEPARTMENT_OTHER): Payer: Self-pay | Admitting: General Surgery

## 2015-07-16 NOTE — Addendum Note (Signed)
Addendum  created 07/16/15 0845 by Tawni Millers, CRNA   Modules edited: Charges VN

## 2015-07-16 NOTE — Progress Notes (Signed)
Quick Note:  Inform patient of Pathology report,. Tell her that her lumpectomy showed atypical ductal hyperplasia, but there was no cancer. Nothing further will need to be done. I will discuss this situation with her in detail at her next office visit. Let me know that she made contact with her. ______

## 2015-07-21 NOTE — Addendum Note (Signed)
Addendum  created 07/21/15 0034 by Ernesta Amble Blocker, CRNA   Modules edited: Charges VN

## 2015-08-15 ENCOUNTER — Telehealth: Payer: Self-pay | Admitting: *Deleted

## 2015-08-15 NOTE — Telephone Encounter (Signed)
Received referral from Dr. Andrew Au office requesting pt to see Dr. Jana Hakim.  Called and left a message for the pt to return my call so I can schedule an appt w/ her.

## 2015-08-15 NOTE — Telephone Encounter (Signed)
Pt returned my call and I confirmed 08/26/15 high risk appt w/ pt.

## 2015-08-19 ENCOUNTER — Telehealth: Payer: Self-pay | Admitting: *Deleted

## 2015-08-19 NOTE — Telephone Encounter (Signed)
Pt called stating that she cannot get off of work for her 10/25 appt.  Rescheduled and confirmed pt to see Dr. Jana Hakim on 09/02/15.

## 2015-08-26 ENCOUNTER — Encounter: Payer: BLUE CROSS/BLUE SHIELD | Admitting: Oncology

## 2015-08-26 ENCOUNTER — Other Ambulatory Visit: Payer: BLUE CROSS/BLUE SHIELD

## 2015-09-01 ENCOUNTER — Other Ambulatory Visit: Payer: Self-pay | Admitting: *Deleted

## 2015-09-02 ENCOUNTER — Encounter: Payer: Self-pay | Admitting: Oncology

## 2015-09-02 ENCOUNTER — Other Ambulatory Visit: Payer: Self-pay | Admitting: *Deleted

## 2015-09-02 ENCOUNTER — Other Ambulatory Visit (HOSPITAL_BASED_OUTPATIENT_CLINIC_OR_DEPARTMENT_OTHER): Payer: BLUE CROSS/BLUE SHIELD

## 2015-09-02 ENCOUNTER — Ambulatory Visit (HOSPITAL_BASED_OUTPATIENT_CLINIC_OR_DEPARTMENT_OTHER): Payer: BLUE CROSS/BLUE SHIELD | Admitting: Oncology

## 2015-09-02 VITALS — BP 136/87 | HR 83 | Temp 98.1°F | Resp 18 | Ht 66.0 in | Wt 175.6 lb

## 2015-09-02 DIAGNOSIS — D241 Benign neoplasm of right breast: Secondary | ICD-10-CM

## 2015-09-02 DIAGNOSIS — Z1239 Encounter for other screening for malignant neoplasm of breast: Secondary | ICD-10-CM

## 2015-09-02 DIAGNOSIS — N6091 Unspecified benign mammary dysplasia of right breast: Secondary | ICD-10-CM

## 2015-09-02 LAB — CBC WITH DIFFERENTIAL/PLATELET
BASO%: 1.8 % (ref 0.0–2.0)
BASOS ABS: 0.1 10*3/uL (ref 0.0–0.1)
EOS%: 1.3 % (ref 0.0–7.0)
Eosinophils Absolute: 0.1 10*3/uL (ref 0.0–0.5)
HEMATOCRIT: 40.5 % (ref 34.8–46.6)
HGB: 13.3 g/dL (ref 11.6–15.9)
LYMPH#: 2.8 10*3/uL (ref 0.9–3.3)
LYMPH%: 39.6 % (ref 14.0–49.7)
MCH: 31.1 pg (ref 25.1–34.0)
MCHC: 32.9 g/dL (ref 31.5–36.0)
MCV: 94.5 fL (ref 79.5–101.0)
MONO#: 0.9 10*3/uL (ref 0.1–0.9)
MONO%: 12.1 % (ref 0.0–14.0)
NEUT#: 3.2 10*3/uL (ref 1.5–6.5)
NEUT%: 45.2 % (ref 38.4–76.8)
PLATELETS: 300 10*3/uL (ref 145–400)
RBC: 4.29 10*6/uL (ref 3.70–5.45)
RDW: 12.8 % (ref 11.2–14.5)
WBC: 7.1 10*3/uL (ref 3.9–10.3)

## 2015-09-02 LAB — COMPREHENSIVE METABOLIC PANEL (CC13)
ALT: 14 U/L (ref 0–55)
ANION GAP: 6 meq/L (ref 3–11)
AST: 15 U/L (ref 5–34)
Albumin: 3.7 g/dL (ref 3.5–5.0)
Alkaline Phosphatase: 66 U/L (ref 40–150)
BUN: 16.1 mg/dL (ref 7.0–26.0)
CALCIUM: 9.1 mg/dL (ref 8.4–10.4)
CHLORIDE: 108 meq/L (ref 98–109)
CO2: 27 meq/L (ref 22–29)
Creatinine: 0.8 mg/dL (ref 0.6–1.1)
EGFR: >90 mL/min/{1.73_m2} (ref >90)
Glucose: 89 mg/dl (ref 70–140)
POTASSIUM: 4.4 meq/L (ref 3.5–5.1)
Sodium: 141 mEq/L (ref 136–145)
Total Bilirubin: 0.49 mg/dL (ref 0.20–1.20)
Total Protein: 6.9 g/dL (ref 6.4–8.3)

## 2015-09-02 MED ORDER — TAMOXIFEN CITRATE 20 MG PO TABS: 20.0000 mg | ORAL_TABLET | Freq: Every day | ORAL | Status: DC

## 2015-09-02 NOTE — Progress Notes (Signed)
[place]  Telephone:(336) [phone] Fax:(336) [phone]     ID: [name] DOB: [birth year]  MR#: [id number]  EPP#:[id number]  Patient Care Team: Gaynelle Arabian, MD as PCP - General (Family Medicine) Arlyce Harman, NP as Nurse Practitioner (Nurse Practitioner) Fanny Skates, MD as Consulting Physician (General Surgery) Chauncey Cruel, MD as Consulting Physician (Oncology) Gavin Pound, MD as Consulting Physician (Rheumatology) Druscilla Brownie, MD as Consulting Physician (Dermatology) OTHER MD:  CHIEF COMPLAINT: atypical ductal hyperplasia  CURRENT TREATMENT: tamoxifen   BREAST CANCER HISTORY: Alexandra Carlson had routine screening bilateral mammography with tomography 06/02/2015 at the breast Center, showing some calcifications in the right breast. On 08/06/2015 she underwent diagnostic right mammography. This found the breast density to be category C. In the lower outer quadrant of the right breast there was a group of punctate and pleomorphic calcifications measuring 1.5 cm. Stereotactic biopsy of this area was recommended and performed 06/10/2015. This showed ([id number]) a ductal papilloma with atypical ductal hyperplasia.  Accordingly the patient was referred to surgery and on 07/15/2015 underwent right lumpectomy. The pathology from this procedure ([id number]) showed an intraductal papilloma with atypical ductal hyperplasia. There was no evidence of invasive disease.  The patient's subsequent history is as detailed below  INTERVAL HISTORY: Alexandra Carlson was evaluated in the high risk clinic 09/02/2015.  REVIEW OF SYSTEMS: There were no specific symptoms leading to the original mammogram, which was routinely scheduled. The patient denies unusual headaches, visual changes, nausea, vomiting, stiff neck, dizziness, or gait imbalance. There has been no cough, phlegm production, or pleurisy, no chest pain or pressure, and no change in bowel or bladder habits. The  patient denies fever, rash, bleeding, unexplained fatigue or unexplained weight loss. She currently is not exercising regularly. When she does exercise she likes to walk. A detailed review of systems was otherwise entirely negative.  PAST MEDICAL HISTORY: Past Medical History  Diagnosis Date  . Arthritis     psoriatic arthritis  . Atypical ductal hyperplasia of breast     right  . Migraines, neuralgic     PAST SURGICAL HISTORY: Past Surgical History  Procedure Laterality Date  . Rhinoplasty    . Knee arthroscopy Right   . Breast lumpectomy with radioactive seed localization Right 07/15/2015    Procedure: BREAST LUMPECTOMY WITH RADIOACTIVE SEED LOCALIZATION;  Surgeon: Fanny Skates, MD;  Location: [facility];  Service: General;  Laterality: Right;    FAMILY HISTORY No family history on file. The patient's parents are currently in their early 63s. The patient has one brother, no sisters. There is no history of breast or ovarian cancer in the family. (Note: The patient has little information regarding the paternal side).  GYNECOLOGIC HISTORY:  No LMP recorded. Menarche age 18, first live birth age 6. The patient is GX P1. She is having regular periods, the most recent 08/27/2015. Her periods occur every 28 days, and last about 7 days, of which only the first is heavy. She has been using oral contraceptives on and off since she was 44 years old  SOCIAL HISTORY:  Caris works as a Field seismologist. Her husband Alexandra Carlson") works in an apartment maintenance. Their daughter Alexandra Carlson, 48 years old, is in high school. The patient attends a local [employer]: Not in place   HEALTH MAINTENANCE: Social History  Substance Use Topics  . Smoking status: Never Smoker   . Smokeless tobacco: Not on file  . Alcohol Use: No  Colonoscopy:  PAP: UTD  Bone density:  Lipid panel:  Allergies  Allergen Reactions  . Codeine Nausea Only      Current Outpatient Prescriptions  Medication Sig Dispense Refill  . etanercept (ENBREL) 25 MG injection Inject 25 mg into the skin once a week.    Marland Kitchen HYDROcodone-acetaminophen (NORCO) 5-325 MG per tablet Take 1-2 tablets by mouth every 6 (six) hours as needed for moderate pain or severe pain. 30 tablet 0  . meloxicam (MOBIC) 15 MG tablet Take 15 mg by mouth daily.    . Norgestimate-Ethinyl Estradiol Triphasic (ORTHO TRI-CYCLEN LO) 0.18/0.215/0.25 MG-25 MCG tab Take 1 tablet by mouth daily.    Marland Kitchen triamcinolone cream (KENALOG) 0.5 %   0  . valACYclovir (VALTREX) 1000 MG tablet   5   No current facility-administered medications for this visit.    OBJECTIVE: Middle-aged white woman in no acute distress Filed Vitals:   09/02/15 1618  BP: 136/87  Pulse: 83  Temp: 98.1 F (36.7 C)  Resp: 18     Body mass index is 28.36 kg/(m^2).    ECOG FS:0 - Asymptomatic  Ocular: Sclerae unicteric, pupils equal, round and reactive to light Ear-nose-throat: Oropharynx clear and moist Lymphatic: No cervical or supraclavicular adenopathy Lungs no rales or rhonchi, good excursion bilaterally Heart regular rate and rhythm, no murmur appreciated Abd soft, nontender, positive bowel sounds MSK no focal spinal tenderness, no joint edema Neuro: non-focal, well-oriented, appropriate affect Breasts: The right breast is status post recent biopsy. There is no ecchymosis. There is no palpable lump. There is no skin or nipple change of concern. The right axilla is benign. Left breast is unremarkable.   LAB RESULTS:  CMP     Component Value Date/Time   NA 141 09/02/2015 1607   K 4.4 09/02/2015 1607   CO2 27 09/02/2015 1607   GLUCOSE 89 09/02/2015 1607   BUN 16.1 09/02/2015 1607   CREATININE 0.8 09/02/2015 1607   CALCIUM 9.1 09/02/2015 1607   PROT 6.9 09/02/2015 1607   ALBUMIN 3.7 09/02/2015 1607   AST 15 09/02/2015 1607   ALT 14 09/02/2015 1607   ALKPHOS 66 09/02/2015 1607   BILITOT 0.49 09/02/2015  1607    INo results found for: SPEP, UPEP  Lab Results  Component Value Date   WBC 7.1 09/02/2015   NEUTROABS 3.2 09/02/2015   HGB 13.3 09/02/2015   HCT 40.5 09/02/2015   MCV 94.5 09/02/2015   PLT 300 09/02/2015      Chemistry      Component Value Date/Time   NA 141 09/02/2015 1607   K 4.4 09/02/2015 1607   CO2 27 09/02/2015 1607   BUN 16.1 09/02/2015 1607   CREATININE 0.8 09/02/2015 1607      Component Value Date/Time   CALCIUM 9.1 09/02/2015 1607   ALKPHOS 66 09/02/2015 1607   AST 15 09/02/2015 1607   ALT 14 09/02/2015 1607   BILITOT 0.49 09/02/2015 1607       No results found for: LABCA2  No components found for: LABCA125  No results for input(s): INR in the last 168 hours.  Urinalysis No results found for: COLORURINE, APPEARANCEUR, LABSPEC, PHURINE, GLUCOSEU, HGBUR, BILIRUBINUR, KETONESUR, PROTEINUR, UROBILINOGEN, NITRITE, LEUKOCYTESUR  STUDIES: Breast density is category C  ASSESSMENT: 44 y.o. [employer], [place] woman status post right breast lower outer quadrant lumpectomy for an intraductal papilloma with atypical ductal hyperplasia  PLAN: I spent a little over an hour with Amra today going over her situation in detail.  When we enter her data into the [facility], it predicts a 2.3% risk of her developing invasive breast cancer in either breast or over the next 5 years. Her lifetime risk if she lives to be 63 would be 24% (as opposed to 12.2% in otherwise normal women). This does not take breast density into account, which might slightly increase the risk further.  She understands that oral contraceptives do not increase the risk of breast cancer. On the other hand whether or not she continues on these may depend on how she chooses to lower her risk of breast cancer if she chooses to try a risk lowering strategy.  We considered bilateral mastectomies but this in my opinion is way overkill. We then discussed bilateral yearly MRIs. Because her lifetime risk  is greater than 20% she is a candidate for this option. This also would take care of the breast density issue in terms of sensitivity. MRI involves no additional radiation.   There is the issue of cost, of course. If her insurance were to pay 80%, that would still be $400 out of pocket a year. More importantly though, yearly MRIs increase the risk of finding "false positives". These are abnormalities that prove to be benign but that require evaluation, frequently biopsy. For those reasons I do not favor this option in her case, although it is certainly something we couldn't do if she is attracted to the possibility.  I think a very reasonable option for her would be anti-estrogens. Since she is premenopausal, this would mean either tamoxifen or raloxifene. Either of these would lower the risk of breast cancer by approximately half. We then discussed the possible toxicities, side effects and complications of these agents including hot flashes, the risk of blood clots (identical to the risk of clots with birth control pills), endometrial hyperplasia, polyps, or rarely endometrial cancer (generally not an issue when the patient continues to menstruate), and other secondary issues such as cataract growths, which generally lead [place] to recommend a yearly eye exam (something she is doing in any case).  If she does choose tamoxifen and proves able to tolerated easily, then I would suggest instead of oral contraceptives she use the Mirena IUD. This has been shown not to increase the risk of breast cancer in the setting and to greatly decrease or eliminate concerns regarding endometrial hyperplasia and the development of endometrial polyps. I suggested she discuss this with her gynecologist.  After much discussion Yula is interested in trying tamoxifen and I have placed that prescription for her. I will see her again in 3 months and if she tolerates it well, the plan would be to take this medication for the next 5  years. If she usually sees her gynecologist in April, then seeing [place] in October would be optimal, since that way she would have a physician breast exam every 6 months in addition to her own monthly breast self exams (also discussed today).  Merlina has a good understanding of the overall plan. She agrees with it. She knows the goal of treatment in her case is prevention. She will call with any problems that may develop before her next visit here.  Chauncey Cruel, MD   09/02/2015 5:21 PM Medical Oncology and Hematology [facility] [address] [place], [place] [address] Tel. [phone]    Fax. [phone]

## 2015-09-03 ENCOUNTER — Telehealth: Payer: Self-pay | Admitting: Oncology

## 2015-09-03 ENCOUNTER — Other Ambulatory Visit: Payer: Self-pay | Admitting: *Deleted

## 2015-09-03 NOTE — Telephone Encounter (Signed)
Called and left a message with follow up appointment °

## 2015-12-11 ENCOUNTER — Telehealth: Payer: Self-pay | Admitting: Oncology

## 2015-12-11 ENCOUNTER — Ambulatory Visit (HOSPITAL_BASED_OUTPATIENT_CLINIC_OR_DEPARTMENT_OTHER): Payer: BLUE CROSS/BLUE SHIELD | Admitting: Oncology

## 2015-12-11 VITALS — BP 134/84 | HR 85 | Temp 98.1°F | Resp 18 | Ht 66.0 in | Wt 177.2 lb

## 2015-12-11 DIAGNOSIS — D241 Benign neoplasm of right breast: Secondary | ICD-10-CM

## 2015-12-11 DIAGNOSIS — N6091 Unspecified benign mammary dysplasia of right breast: Secondary | ICD-10-CM | POA: Diagnosis not present

## 2015-12-11 DIAGNOSIS — Z1239 Encounter for other screening for malignant neoplasm of breast: Secondary | ICD-10-CM

## 2015-12-11 NOTE — Progress Notes (Signed)
[place]  Telephone:(336) [phone] Fax:(336) [phone]     ID: Alexandra Carlson DOB: 06/05/1971  MR#: [id number]  SXJ#:[id number]  Patient Care Team: Alexandra Arabian, MD as PCP - General (Family Medicine) Alexandra Harman, NP as Nurse Practitioner (Nurse Practitioner) Alexandra Skates, MD as Consulting Physician (General Surgery) Alexandra Cruel, MD as Consulting Physician (Oncology) Alexandra Pound, MD as Consulting Physician (Rheumatology) Alexandra Brownie, MD as Consulting Physician (Dermatology) OTHER MD:  CHIEF COMPLAINT: atypical ductal hyperplasia  CURRENT TREATMENT: tamoxifen   BREAST CANCER HISTORY: From the original intake note:  Alexandra Carlson had routine screening bilateral mammography with tomography 06/02/2015 at the breast Center, showing some calcifications in the right breast. On 08/06/2015 she underwent diagnostic right mammography. This found the breast density to be category C. In the lower outer quadrant of the right breast there was a group of punctate and pleomorphic calcifications measuring 1.5 cm. Stereotactic biopsy of this area was recommended and performed 06/10/2015. This showed ([id number]) a ductal papilloma with atypical ductal hyperplasia.  Accordingly the patient was referred to surgery and on 07/15/2015 underwent right lumpectomy. The pathology from this procedure ([id number]) showed an intraductal papilloma with atypical ductal hyperplasia. There was no evidence of invasive disease.  The patient's subsequent history is as detailed below  INTERVAL HISTORY: Alexandra Carlson Returns today for follow-up of her history of atypical ductal hyperplasia. Because of the high risk of breast cancer developing we started tamoxifen November 2016. She has tolerated that with no side effects that she is aware of. In particular she has had no hot flashes or vaginal wetness. She obtained it at no cost.  She then met with her gynecologist and after appropriate  discussion had a Mirena IUD placed. She is having no problems from that either. She did have a period the next 2 months but not since.  REVIEW OF SYSTEMS: She is not exercising regularly. A detailed review of systems today was otherwise noncontributory  PAST MEDICAL HISTORY: Past Medical History  Diagnosis Date  . Arthritis     psoriatic arthritis  . Atypical ductal hyperplasia of breast     right  . Migraines, neuralgic     PAST SURGICAL HISTORY: Past Surgical History  Procedure Laterality Date  . Rhinoplasty    . Knee arthroscopy Right   . Breast lumpectomy with radioactive seed localization Right 07/15/2015    Procedure: BREAST LUMPECTOMY WITH RADIOACTIVE SEED LOCALIZATION;  Surgeon: Alexandra Skates, MD;  Location: [facility];  Service: General;  Laterality: Right;    FAMILY HISTORY No family history on file. The patient's parents are currently in their early 31s. The patient has one brother, no sisters. There is no history of breast or ovarian cancer in the family. (Note: The patient has little information regarding the paternal side).  GYNECOLOGIC HISTORY:  No LMP recorded. Menarche age 12, first live birth age 34. The patient is GX P1. She is having regular periods, the most recent 08/27/2015. Her periods occur every 28 days, and last about 7 days, of which only the first is heavy. 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 Her husband Alexandra Carlson") works in an apartment maintenance. Their daughter Alexandra Carlson, 39 years old, is in high school. The patient attends a local [employer]: Not in place   HEALTH MAINTENANCE: Social History  Substance Use Topics  .  Smoking status: Never Smoker   . Smokeless tobacco: Not on file  . Alcohol Use: No     Colonoscopy:  PAP: UTD  Bone density:  Lipid panel:  Allergies  Allergen Reactions    . Codeine Nausea Only    Current Outpatient Prescriptions  Medication Sig Dispense Refill  . diphenhydrAMINE (BENADRYL) 25 MG tablet Take 1 tablet (25 mg total) by mouth at bedtime as needed. 30 tablet 0  . etanercept (ENBREL) 25 MG injection Inject 25 mg into the skin once a week.    . meloxicam (MOBIC) 15 MG tablet Take 15 mg by mouth daily.    . Norgestimate-Ethinyl Estradiol Triphasic (ORTHO TRI-CYCLEN LO) 0.18/0.215/0.25 MG-25 MCG tab Take 1 tablet by mouth daily.    . tamoxifen (NOLVADEX) 20 MG tablet Take 1 tablet (20 mg total) by mouth daily. 90 tablet 12  . triamcinolone cream (KENALOG) 0.5 %   0  . valACYclovir (VALTREX) 1000 MG tablet   5   No current facility-administered medications for this visit.    OBJECTIVE: Middle-aged white woman who appears well Filed Vitals:   12/11/15 1311  BP: 134/84  Pulse: 85  Temp: 98.1 F (36.7 C)  Resp: 18     Body mass index is 28.61 kg/(m^2).    ECOG FS:0 - Asymptomatic  Sclerae unicteric, pupils round and equal Oropharynx clear and moist-- no thrush or other lesions No cervical or supraclavicular adenopathy Lungs no rales or rhonchi Heart regular rate and rhythm Abd soft, nontender, positive bowel sounds MSK no focal spinal tenderness, no upper extremity lymphedema Neuro: nonfocal, well oriented, appropriate affect Breasts:the right breast is status post lumpectomy. There is no finding of concern. The right axilla is benign per the left breast is unremarkable.    LAB RESULTS:  CMP     Component Value Date/Time   NA 141 09/02/2015 1607   K 4.4 09/02/2015 1607   CO2 27 09/02/2015 1607   GLUCOSE 89 09/02/2015 1607   BUN 16.1 09/02/2015 1607   CREATININE 0.8 09/02/2015 1607   CALCIUM 9.1 09/02/2015 1607   PROT 6.9 09/02/2015 1607   ALBUMIN 3.7 09/02/2015 1607   AST 15 09/02/2015 1607   ALT 14 09/02/2015 1607   ALKPHOS 66 09/02/2015 1607   BILITOT 0.49 09/02/2015 1607    INo results found for: SPEP, UPEP  Lab  Results  Component Value Date   WBC 7.1 09/02/2015   NEUTROABS 3.2 09/02/2015   HGB 13.3 09/02/2015   HCT 40.5 09/02/2015   MCV 94.5 09/02/2015   PLT 300 09/02/2015      Chemistry      Component Value Date/Time   NA 141 09/02/2015 1607   K 4.4 09/02/2015 1607   CO2 27 09/02/2015 1607   BUN 16.1 09/02/2015 1607   CREATININE 0.8 09/02/2015 1607      Component Value Date/Time   CALCIUM 9.1 09/02/2015 1607   ALKPHOS 66 09/02/2015 1607   AST 15 09/02/2015 1607   ALT 14 09/02/2015 1607   BILITOT 0.49 09/02/2015 1607       No results found for: LABCA2  No components found for: LABCA125  No results for input(s): INR in the last 168 hours.  Urinalysis No results found for: COLORURINE, APPEARANCEUR, LABSPEC, PHURINE, GLUCOSEU, HGBUR, BILIRUBINUR, KETONESUR, PROTEINUR, UROBILINOGEN, NITRITE, LEUKOCYTESUR  STUDIES: Breast density is category C  ASSESSMENT: 45 y.o. [employer], [place] woman status post right breast lower outer quadrant lumpectomy for an intraductal papilloma with atypical ductal hyperplasia  (  a) started tamoxifen for risk reduction November 2016  (b) Mirena IUD placed November 2016  PLAN Parnika is doing fine with her tamoxifen and the plan will be to continue that for a total of 5 years. She is also doing very well with a Mirena IUD.  She sees her gynecologist usually in April or May. She sees Dr. Lenna Gilford usually March/ Septemberperiod were going to see her in November and yearly thereafter until she completes her 5 years on tamoxifen  She knows to call for any problems that may develop before her next visit here.   :Alexandra Cruel, MD   12/11/2015 1:36 PM Medical Oncology and Hematology [facility] [place], [place] [address] Tel. [phone]    Fax. [phone]

## 2015-12-11 NOTE — Telephone Encounter (Signed)
Gave patient avs report and appointments for November.  °

## 2016-03-11 ENCOUNTER — Other Ambulatory Visit (HOSPITAL_COMMUNITY)
Admission: RE | Admit: 2016-03-11 | Discharge: 2016-03-11 | Disposition: A | Discharge status: Home / Self Care | Payer: BLUE CROSS/BLUE SHIELD | Source: Ambulatory Visit | Attending: Nurse Practitioner | Admitting: Nurse Practitioner

## 2016-03-11 ENCOUNTER — Other Ambulatory Visit: Payer: Self-pay | Admitting: Nurse Practitioner

## 2016-03-11 DIAGNOSIS — Z1151 Encounter for screening for human papillomavirus (HPV): Secondary | ICD-10-CM | POA: Insufficient documentation

## 2016-03-11 DIAGNOSIS — Z01419 Encounter for gynecological examination (general) (routine) without abnormal findings: Secondary | ICD-10-CM | POA: Diagnosis present

## 2016-03-15 LAB — CYTOLOGY - PAP

## 2016-05-28 ENCOUNTER — Other Ambulatory Visit: Payer: Self-pay | Admitting: Family Medicine

## 2016-05-28 DIAGNOSIS — Z1231 Encounter for screening mammogram for malignant neoplasm of breast: Secondary | ICD-10-CM

## 2016-06-03 ENCOUNTER — Ambulatory Visit
Admission: RE | Admit: 2016-06-03 | Discharge: 2016-06-03 | Disposition: A | Discharge status: Home / Self Care | Payer: PRIVATE HEALTH INSURANCE | Source: Ambulatory Visit | Attending: Family Medicine | Admitting: Family Medicine

## 2016-06-03 DIAGNOSIS — Z1231 Encounter for screening mammogram for malignant neoplasm of breast: Secondary | ICD-10-CM

## 2016-09-07 ENCOUNTER — Ambulatory Visit (HOSPITAL_BASED_OUTPATIENT_CLINIC_OR_DEPARTMENT_OTHER): Payer: PRIVATE HEALTH INSURANCE | Admitting: Oncology

## 2016-09-07 VITALS — BP 140/78 | HR 78 | Temp 98.2°F | Resp 18 | Wt 172.3 lb

## 2016-09-07 DIAGNOSIS — Z1239 Encounter for other screening for malignant neoplasm of breast: Secondary | ICD-10-CM

## 2016-09-07 DIAGNOSIS — N6091 Unspecified benign mammary dysplasia of right breast: Secondary | ICD-10-CM

## 2016-09-07 MED ORDER — ZOLPIDEM TARTRATE ER 12.5 MG PO TBCR: 12.5000 mg | EXTENDED_RELEASE_TABLET | Freq: Every evening | ORAL | 0 refills | Status: DC | PRN

## 2016-09-07 MED ORDER — TAMOXIFEN CITRATE 20 MG PO TABS: 20.0000 mg | ORAL_TABLET | Freq: Every day | ORAL | 12 refills | Status: DC

## 2016-09-07 NOTE — Progress Notes (Signed)
[place]  Telephone:([phone] Fax:(336) [phone]     ID: Alexandra Carlson DOB: 11/15/1970  MR#: [id number]  [id number]  Patient Care Team: Gaynelle Arabian, MD as PCP - General (Family Medicine) Arlyce Harman, NP as Nurse Practitioner (Nurse Practitioner) Fanny Skates, MD as Consulting Physician (General Surgery) Chauncey Cruel, MD as Consulting Physician (Oncology) Gavin Pound, MD as Consulting Physician (Rheumatology) Druscilla Brownie, MD as Consulting Physician (Dermatology) OTHER MD:  CHIEF COMPLAINT: atypical ductal hyperplasia  CURRENT TREATMENT: tamoxifen   BREAST CANCER HISTORY: From the original intake note:  Alexandra Carlson had routine screening bilateral mammography with tomography 06/02/2015 at the breast Center, showing some calcifications in the right breast. On 08/06/2015 she underwent diagnostic right mammography. This found the breast density to be category C. In the lower outer quadrant of the right breast there was a group of punctate and pleomorphic calcifications measuring 1.5 cm. Stereotactic biopsy of this area was recommended and performed 06/10/2015. This showed ([facility] [place]:[id number]) a ductal papilloma with atypical ductal hyperplasia.  Accordingly the patient was referred to surgery and on 07/15/2015 underwent right lumpectomy. The pathology from this procedure ([id number]) showed an intraductal papilloma with atypical ductal hyperplasia. There was no evidence of invasive disease.  The patient's subsequent history is as detailed below  INTERVAL HISTORY: Alexandra Carlson returns to the high-risk clinic for follow-up of her history of atypical ductal she continues on tamoxifen, with excellent tolerance. She doesn't have hot flashes, vaginal discharge, or any other side effects from that medication that she is aware of she obtains it at a rather high price, $50 for 3 months.  Pap smear 03/11/2016 showed no intraepithelial lesions or malignancy ([place]).  REVIEW OF SYSTEMS: She has psoriatic arthritis but this is well-controlled on Enbrel. She only has 2 patches right now she says, on an elbow and an year. She has been doing some running and so has developed plantar fasciitis and she is seeing Ortho ortho about this. Aside from this a detailed review of systems today was stable   PAST MEDICAL HISTORY: Past Medical History:  Diagnosis Date  . Arthritis    psoriatic arthritis  . Atypical ductal hyperplasia of breast    right  . Migraines, neuralgic     PAST SURGICAL HISTORY: Past Surgical History:  Procedure Laterality Date  . BREAST LUMPECTOMY WITH RADIOACTIVE SEED LOCALIZATION Right 07/15/2015   Procedure: BREAST LUMPECTOMY WITH RADIOACTIVE SEED LOCALIZATION;  Surgeon: Fanny Skates, MD;  Location: [facility];  Service: General;  Laterality: Right;  . KNEE ARTHROSCOPY Right   . RHINOPLASTY      FAMILY HISTORY No family history on file. The patient's parents are currently in their early 22s. The patient has one brother, no sisters. There is no history of breast or ovarian cancer in the family. (Note: The patient has little information regarding the paternal side).  GYNECOLOGIC HISTORY:  No LMP recorded. Menarche age 61, first live birth age 28. The patient is GX P1. She is having regular periods, the most recent 08/27/2015. Her periods occur every 28 days, and last about 7 days, of which only the first is heavy. 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 Her husband Alexandra Carlson") works in an apartment maintenance. Their daughter Alexandra Carlson, 59 years old, is in high school. The patient attends a local [employer]: Not  in place   HEALTH MAINTENANCE: Social History  Substance Use Topics  . Smoking status: Never Smoker  . Smokeless tobacco: Not on file  . Alcohol use No      Colonoscopy:  PAP: UTD  Bone density:  Lipid panel:  Allergies  Allergen Reactions  . Codeine Nausea Only    Current Outpatient Prescriptions  Medication Sig Dispense Refill  . diphenhydrAMINE (BENADRYL) 25 MG tablet Take 1 tablet (25 mg total) by mouth at bedtime as needed. 30 tablet 0  . etanercept (ENBREL) 25 MG injection Inject 25 mg into the skin once a week.    Marland Kitchen levonorgestrel (MIRENA) 20 MCG/24HR IUD 1 each by Intrauterine route once.    . meloxicam (MOBIC) 15 MG tablet Take 15 mg by mouth daily.    . tamoxifen (NOLVADEX) 20 MG tablet Take 1 tablet (20 mg total) by mouth daily. 90 tablet 12  . triamcinolone cream (KENALOG) 0.5 %   0  . valACYclovir (VALTREX) 1000 MG tablet   5   No current facility-administered medications for this visit.     OBJECTIVE: Middle-aged white woman In no acute distress  Vitals:   09/07/16 1426  BP: 140/78  Pulse: 78  Resp: 18  Temp: 98.2 F (36.8 C)     Body mass index is 27.81 kg/m.    ECOG FS:1 - Symptomatic but completely ambulatory  Sclerae unicteric, EOMs intact Oropharynx clear and moist No cervical or supraclavicular adenopathy Lungs no rales or rhonchi Heart regular rate and rhythm Abd soft, nontender, positive bowel sounds MSK no focal spinal tenderness, no upper extremity lymphedema Neuro: nonfocal, well oriented, appropriate affect Breasts: The right breast is status post lumpectomy. There is no evidence of disease recurrence. The right axilla is benign. The left breast is unremarkable   LAB RESULTS:  CMP     Component Value Date/Time   NA 141 09/02/2015 1607   K 4.4 09/02/2015 1607   CO2 27 09/02/2015 1607   GLUCOSE 89 09/02/2015 1607   BUN 16.1 09/02/2015 1607   CREATININE 0.8 09/02/2015 1607   CALCIUM 9.1 09/02/2015 1607   PROT 6.9 09/02/2015 1607   ALBUMIN 3.7 09/02/2015 1607   AST 15 09/02/2015 1607   ALT 14 09/02/2015 1607   ALKPHOS 66 09/02/2015 1607   BILITOT 0.49 09/02/2015 1607    INo  results found for: SPEP, UPEP  Lab Results  Component Value Date   WBC 7.1 09/02/2015   NEUTROABS 3.2 09/02/2015   HGB 13.3 09/02/2015   HCT 40.5 09/02/2015   MCV 94.5 09/02/2015   PLT 300 09/02/2015      Chemistry      Component Value Date/Time   NA 141 09/02/2015 1607   K 4.4 09/02/2015 1607   CO2 27 09/02/2015 1607   BUN 16.1 09/02/2015 1607   CREATININE 0.8 09/02/2015 1607      Component Value Date/Time   CALCIUM 9.1 09/02/2015 1607   ALKPHOS 66 09/02/2015 1607   AST 15 09/02/2015 1607   ALT 14 09/02/2015 1607   BILITOT 0.49 09/02/2015 1607       No results found for: LABCA2  No components found for: LABCA125  No results for input(s): INR in the last 168 hours.  Urinalysis No results found for: COLORURINE, APPEARANCEUR, LABSPEC, PHURINE, GLUCOSEU, HGBUR, BILIRUBINUR, KETONESUR, PROTEINUR, UROBILINOGEN, NITRITE, LEUKOCYTESUR  STUDIES: CLINICAL DATA:  Screening.  EXAM: 2D DIGITAL SCREENING BILATERAL MAMMOGRAM WITH CAD AND ADJUNCT TOMO  COMPARISON:  Previous exam(s).  ACR Breast Density  Category b: There are scattered areas of fibroglandular density.  FINDINGS: There are no findings suspicious for malignancy. Images were processed with CAD.  IMPRESSION: No mammographic evidence of malignancy. A result letter of this screening mammogram will be mailed directly to the patient.  RECOMMENDATION: Screening mammogram in one year. (Code:SM-B-01Y)  BI-RADS CATEGORY  1: Negative.   Electronically Signed   By: Abelardo Diesel M.D.   On: 06/04/2016 08:09 already already   ASSESSMENT: 45 y.o. [employer], [place] woman status post right breast lower outer quadrant lumpectomy 07/15/2015 for an intraductal papilloma with atypical ductal hyperplasia  (a) started tamoxifen for risk reduction November 2016  (b) Mirena IUD placed November 2016  PLAN Addelyn is now a little over a year out from surgery for atypical ductal hyperplasia with no evidence of  disease activity. This is favorable.  She is also one year into the 5 planned years of tamoxifen with excellent tolerance.  I pointed out that her breast density has decreased, from C2 B. This makes the mammograms more sensitive.  She will see her gynecologist again next May. She will see me again in a year. That way she can continue to have a breast exam every 6 months.   She knows to call for any problems that may develop before her next visit here.  :Chauncey Cruel, MD   09/07/2016 2:43 PM Medical Oncology and Hematology [facility] [place], [place] [address] Tel. [phone]    Fax. [phone]

## 2016-09-08 ENCOUNTER — Other Ambulatory Visit: Payer: Self-pay | Admitting: *Deleted

## 2016-09-08 MED ORDER — ZOLPIDEM TARTRATE ER 6.25 MG PO TBCR: 6.2500 mg | EXTENDED_RELEASE_TABLET | Freq: Every evening | ORAL | 0 refills | Status: DC | PRN

## 2016-09-08 NOTE — Telephone Encounter (Signed)
This RN was contacted by [facility] per noted Ambien CR prescription dose increased to 12.5 from prior prescription written by another MD with dose of 6.25.  Informed Doren Custard to dispense the Ambien at the 6.25 mg dose.

## 2017-04-04 ENCOUNTER — Encounter: Payer: Self-pay | Admitting: Sports Medicine

## 2017-04-04 ENCOUNTER — Ambulatory Visit (INDEPENDENT_AMBULATORY_CARE_PROVIDER_SITE_OTHER): Payer: PRIVATE HEALTH INSURANCE | Admitting: Sports Medicine

## 2017-04-04 VITALS — BP 128/78 | Ht 66.0 in | Wt 173.0 lb

## 2017-04-04 DIAGNOSIS — M7662 Achilles tendinitis, left leg: Secondary | ICD-10-CM

## 2017-04-04 DIAGNOSIS — M79673 Pain in unspecified foot: Secondary | ICD-10-CM

## 2017-04-04 MED ORDER — NITROGLYCERIN 0.2 MG/HR TD PT24: MEDICATED_PATCH | TRANSDERMAL | 1 refills | Status: DC

## 2017-04-04 NOTE — Progress Notes (Signed)
   Subjective:    Alexandra Carlson - 46 y.o. female MRN [id number]  Date of birth: Feb 15, 1971  CC: L Heel pain  HPI: Alexandra Carlson is a 46 y/o female with hx of psoriatic arthritis presenting with left heel pain. Her symptoms started about 15 months ago and she underwent workup which showed "bone spur along with achilles tendonitis." Subsequently she completed PT sessions without significant improvement, her last session was five months ago. She describes the pain as "sharp" in the back of her heel which is worse with activity or at night after being on her feet all day at work. She is chronically taking Meloxicam 15mg  qd to treat her psoriatic arthritis. She has tried wearing generic orthotics but they do not alleviate her pain. Denies trauma to her ankle. Denies radiation, numbness, or tingling. 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  ROS: Denies fevers, chills, or weight changes. Negative except per HPI  Objective:   Physical Exam BP 128/78   Ht 5\' 6"  (1.676 m)   Wt 173 lb (78.5 kg)   BMI 27.92 kg/m  Gen: NAD, alert, cooperative with exam, well-appearing Skin: no rashes, normal turgor  Psych: good insight, alert and oriented L Ankle: No visible swelling or erythema Arch: Normal w/o pes cavus or planus  Toes: no claw or hammer deformity TTP over posterior superior aspect of calcaneus, no tenderness at the achilles midportion No tenderness over lateral and medial malleolus Full ROM in plantarflexion, dorsiflexion, inversion, and eversion of the foot; flexion and extension of the toes Strength: 5/5 Sensation: intact Vascular: intact w/ dorsalis pedis & posterior tibialis pulses 2+ Walking with a mild limp  Left heel U/S: Limited images were obtained. Patient has obvious calcification within the distal Achilles tendon at the insertion onto the calcaneus. Small hypoechoic area seen in this area consistent with a probable small tear. Findings are  consistent with chronic insertional calcific Achilles tendinopathy.   Assessment & Plan:  Alexandra Carlson is a 46 y/o female presenting with left heel pain  Left achilles tendinopathy Her symptoms and exam are consistent with achilles tendinopathy likely due to overuse. We discussed performing heel drop exercises to strengthen the tendon. She will also benefit from heel lift orthotics that will alleviate traction on her tendons. I would also recommend use of nitroglycerin patches daily. Alternatively, I also discussed with her that we can try a walking boot if her symptoms worsen. -Heel drop exercises -Heel lift orthotics -Apply nitroglycerin patch daily -F/u in 6 weeks for reevaluation and repeat US  I personally was present and performed or re-performed the history, physical exam and medical decision-making activities of this service and have verified that the service and findings are accurately documented in the student's note. Proceed with treatment as outlined above and follow-up in 6 weeks. I've explained to the patient that this is a chronic problem which will take several months to improve. She understands. I also discussed a brief period of immobilization in a [place] if she develops a significant limp.

## 2017-05-17 ENCOUNTER — Ambulatory Visit (INDEPENDENT_AMBULATORY_CARE_PROVIDER_SITE_OTHER): Payer: PRIVATE HEALTH INSURANCE | Admitting: Sports Medicine

## 2017-05-17 ENCOUNTER — Ambulatory Visit: Payer: Self-pay

## 2017-05-17 VITALS — BP 112/80 | Ht 66.0 in | Wt 175.0 lb

## 2017-05-17 DIAGNOSIS — M79672 Pain in left foot: Secondary | ICD-10-CM | POA: Diagnosis not present

## 2017-05-17 DIAGNOSIS — M7660 Achilles tendinitis, unspecified leg: Secondary | ICD-10-CM | POA: Diagnosis not present

## 2017-05-17 DIAGNOSIS — M6788 Other specified disorders of synovium and tendon, other site: Secondary | ICD-10-CM

## 2017-05-17 NOTE — Progress Notes (Signed)
Chief complaint: Follow-up of left heel pain  History of present illness: Alexandra Carlson is a 46 year old Caucasian female, with past medical history notable for psoriatic arthritis, who presents to the sports medicine office today for follow-up of left heel pain. She was seen here in the office back on 04/04/17 for this pain. Symptoms have been ongoing for approximately 16 months now. In the interval, she does not report of any interval improvement or worsening of symptoms. She reports of pain in the inferior medial portion of the Achilles. She describes the pain as a sharp pain. She describes any type of dorsiflexion or plantar flexion as pain aggravators. She was given topical nitroglycerin, which she reports this helped slightly. She has not had any headaches. She was also given heel lift orthotics, which she reports has helped in the interim. She does not report of any numbness, tingling, or weakness. Does not report of any warmth, erythema, ecchymosis, or effusion.  Review of systems: 12 point review of systems negative, unless stated otherwise in the history of present illness  Physical exam Vital signs reviewed and are listed in chart General: Alert, oriented, appears stated age, no apparent distress HEENT: Moist oral mucosa Respiratory: Normal respirations, speaking in full sentences Cardiac: Normal peripheral pulses, regular rate Integumentary: No obvious rashes of visible skin MSK: Inspection of left Achilles is no obvious deformity or atrophy, she is tender to palpation along the inferior medial aspect of left Achilles, pain elicited with forced dorsiflexion, strength 5/5 and right ankle, sensation 2+ and right foot and ankle, she does have full range of motion with ankle dorsiflexion and plantar flexion  Left Achilles ultrasound: Ultrasound was obtained of left Achilles, including long axis and short axis views. Pictures were stored and saved into the ultrasound machine. Calcaneus was viewed,  with no obvious deformity. Do see hypoechoic changes in the distal Achilles on the long axis view, where it inserts on the calcaneus, showing slight fraying, consisting with insertional Achilles tendinopathy. Proximal Achilles tendon was unremarkable, no evidence of tendinopathy or fraying. On short axis view, do see distal Achilles enlargement consisting with tendinopathy. No abnormalities seen with proximal Achilles on short axis view.  Impression: Insertional Achilles tendinopathy  Assessment and plan 1. Insertional Achilles tendinopathy  Insertional Achilles tendinopathy -She is instructed to continue with physical therapy exercises, with eccentric strength training exercises -Continue with topical nitroglycerin -Continue with heel lift orthotics  She will follow-up in 4-6 weeks or sooner as needed.  Patient seen and evaluated with the sports medicine fellow. I agree with the above plan of care. Today's ultrasound findings show the Achilles tendon to basically be unchanged from her previous scan. I personally reviewed those images. I'll have the patient modify her eccentric exercises. She will not drop her heel down past neutral when performing her heel drop exercises. Continue with topical nitroglycerin and continue with heel left. Follow-up in 6-8 weeks for reevaluation. Call with questions or concerns in the interim.

## 2017-05-19 ENCOUNTER — Other Ambulatory Visit: Payer: Self-pay | Admitting: Family Medicine

## 2017-05-19 DIAGNOSIS — Z1231 Encounter for screening mammogram for malignant neoplasm of breast: Secondary | ICD-10-CM

## 2017-06-06 ENCOUNTER — Encounter: Payer: Self-pay | Admitting: Radiology

## 2017-06-06 ENCOUNTER — Ambulatory Visit
Admission: RE | Admit: 2017-06-06 | Discharge: 2017-06-06 | Disposition: A | Discharge status: Home / Self Care | Payer: PRIVATE HEALTH INSURANCE | Source: Ambulatory Visit | Attending: Family Medicine | Admitting: Family Medicine

## 2017-06-06 DIAGNOSIS — Z1231 Encounter for screening mammogram for malignant neoplasm of breast: Secondary | ICD-10-CM

## 2017-06-11 ENCOUNTER — Telehealth: Payer: Self-pay

## 2017-06-11 NOTE — Telephone Encounter (Signed)
Spoke with patient and she is aware of her new appts due to bmdc  [facility]

## 2017-06-28 ENCOUNTER — Encounter: Payer: Self-pay | Admitting: Sports Medicine

## 2017-06-28 ENCOUNTER — Ambulatory Visit (INDEPENDENT_AMBULATORY_CARE_PROVIDER_SITE_OTHER): Payer: PRIVATE HEALTH INSURANCE | Admitting: Sports Medicine

## 2017-06-28 VITALS — BP 114/70 | Ht 66.0 in | Wt 175.0 lb

## 2017-06-28 DIAGNOSIS — M7662 Achilles tendinitis, left leg: Secondary | ICD-10-CM | POA: Diagnosis not present

## 2017-06-28 NOTE — Progress Notes (Signed)
   Subjective:    Patient ID: Alexandra Carlson, female    DOB: 23-Feb-1971, 46 y.o.   MRN: [id number]  HPI   Patient comes in today for follow-up on insertional Achilles tendinopathy of the left heel. She is doing better. She is wearing her heel lift and using her nitroglycerin patches. She has been on the nitroglycerin for 3 months now. She is doing her eccentric heel drop exercises. She has not returned to any running. She is pleased with her progress thus far. She thinks that she is about 50% improved.     Review of Systems as above    Objective:   Physical Exam  Well-developed, well-nourished. No acute distress.  left heel: There is still prominence of the distal Achilles tendon secondary to a small Haglund's deformity. There is no tenderness to palpation here. No pain with Achilles stretch. No soft tissue swelling. Neurovascularly intact distally. Walking without a limp.      Assessment & PlanI:  Improving insertional Achilles tendinopathy, left heel  Patient has made significant improvement since her last office visit 6 weeks ago. Her symptoms were present for several months prior to that office visit. For that reason I want to continue with her topical nitroglycerin, heel lift, and eccentric exercises. She will follow-up with me in 6 weeks for reevaluation but she may try doing some light running at 4 weeks if her pain allows. We will plan on repeating her ultrasound at her follow-up visit. She'll call with questions or concerns prior to that.

## 2017-08-09 ENCOUNTER — Ambulatory Visit (INDEPENDENT_AMBULATORY_CARE_PROVIDER_SITE_OTHER): Payer: PRIVATE HEALTH INSURANCE | Admitting: Sports Medicine

## 2017-08-09 VITALS — BP 118/82 | Ht 66.0 in | Wt 178.0 lb

## 2017-08-09 DIAGNOSIS — M7662 Achilles tendinitis, left leg: Secondary | ICD-10-CM

## 2017-08-09 NOTE — Progress Notes (Signed)
   Subjective:    Patient ID: Alexandra Carlson, female    DOB: 1971/06/20, 46 y.o.   MRN: [id number]  HPI Alexandra Carlson returns in follow up for Achilles tendinopathy of L foot. Initially saw Dr. Micheline Chapman 05/2017 after 1 year of symptoms (mostly dull pain, occasional burning sensation over posterior ankle). Now reports she is about 75% better from first visit. Not able to run. No worsening of discomfort with walking on flat ground. Walked in the woods with children this weekend and worsened pain x24 hours after this. Has been using the nitroglycerin patches and heel drop exercises.   Review of Systems No new injury. No rash, no weakness or changes in sensation over L foot.     Objective:   Physical Exam BP 118/82   Ht 5\' 6"  (1.676 m)   Wt 178 lb (80.7 kg)   BMI 28.73 kg/m   L foot: No bony abnormality. Normal ankle ROM. Sensation and strength intact. Mildly TTP over insertion of Achilles onto calcaneus. Mild hypertrophy of Achilles tendon. No pain with Achilles stretch.   [place] L foot:  Compared to previous images (05/17/17), decreased thickening of insertion of Achilles onto calcaneus in long view. Persistent calcifications within tendon body immediately proximal to insertion. In short axis view, persistent hypoechoic changes within tendon. Proximal and mid substance achilles tendon normal on [place].      Assessment & Plan:  Achilles tendonopathy: continue nitroglycerin and heel drop exercises. Can consider PT if patient elects. Return to recheck in 6-8 weeks.   Ralene Ok, MD   Patient seen and evaluated with the resident. I agree with the above plan of care. Patient is making slow but steady progress. I did discuss the possibility of formal physical therapy but she would like to wait on that for now. She'll continue with her nitroglycerin, heel lift, and home exercises. I think she can continue with activity as tolerated and will follow-up with me again in 6-8 weeks for reevaluation.

## 2017-09-07 ENCOUNTER — Ambulatory Visit: Payer: PRIVATE HEALTH INSURANCE | Admitting: Oncology

## 2017-09-12 ENCOUNTER — Other Ambulatory Visit: Payer: Self-pay | Admitting: Oncology

## 2017-09-12 NOTE — Progress Notes (Signed)
[place]  Telephone:(336) [phone] Fax:(336) [phone]     ID: Alexandra Carlson DOB: Apr 07, 1971  MR#: [id number]  CZY#:[id number]  Patient Care Team: Gaynelle Arabian, MD as PCP - General (Family Medicine) Thongteum, Maryjo Rochester, NP as Nurse Practitioner (Nurse Practitioner) Fanny Skates, MD as Consulting Physician (General Surgery) Alexsis Branscom, Virgie Dad, MD as Consulting Physician (Oncology) Gavin Pound, MD as Consulting Physician (Rheumatology) Druscilla Brownie, MD as Consulting Physician (Dermatology) Thurman Coyer, DO as Consulting Physician (Sports Medicine) OTHER MD:  CHIEF COMPLAINT: atypical ductal hyperplasia  CURRENT TREATMENT: tamoxifen   BREAST CANCER HISTORY: From the original intake note:  Keeanna had routine screening bilateral mammography with tomography 06/02/2015 at the breast Center, showing some calcifications in the right breast. On 08/06/2015 she underwent diagnostic right mammography. This found the breast density to be category C. In the lower outer quadrant of the right breast there was a group of punctate and pleomorphic calcifications measuring 1.5 cm. Stereotactic biopsy of this area was recommended and performed 06/10/2015. This showed ([id number]) a ductal papilloma with atypical ductal hyperplasia.  Accordingly the patient was referred to surgery and on 07/15/2015 underwent right lumpectomy. The pathology from this procedure ([id number]) showed an intraductal papilloma with atypical ductal hyperplasia. There was no evidence of invasive disease.  The patient's subsequent history is as detailed below  INTERVAL HISTORY: Alexandra Carlson returns today for 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  She continues on tamoxifen, which she tolerates well. She notes occasional hot flashes that are manageable. She denies vaginal dryness. She is able to obtain tamoxifen at a reasonable price.   REVIEW OF SYSTEMS: Alexandra Carlson is doing well.  She is currently not exercising because of an injury to her left achilles tendon. She is being followed by Dr. Jonelle Sports. She denies unusual headaches, visual changes, nausea, vomiting, or dizziness. There has been no unusual cough, phlegm production, or pleurisy. This been no change in bowel or bladder habits. She denies unexplained fatigue or unexplained weight loss, bleeding, rash, or fever. A detailed review of systems was otherwise entirely stable.   PAST MEDICAL HISTORY: Past Medical History:  Diagnosis Date  . Arthritis    psoriatic arthritis  . Atypical ductal hyperplasia of breast    right  . Migraines, neuralgic     PAST SURGICAL HISTORY: Past Surgical History:  Procedure Laterality Date  . BREAST BIOPSY Right 06/19/2012  . BREAST EXCISIONAL BIOPSY Right 07/2012  . BREAST LUMPECTOMY WITH RADIOACTIVE SEED LOCALIZATION Right 07/15/2015   Procedure: BREAST LUMPECTOMY WITH RADIOACTIVE SEED LOCALIZATION;  Surgeon: Fanny Skates, MD;  Location: [place];  Service: General;  Laterality: Right;  . KNEE ARTHROSCOPY Right   . RHINOPLASTY      FAMILY HISTORY No family history on file. The patient's parents are currently in their early 51s. The patient has one brother, no sisters. There is no history of breast or ovarian cancer in the family. (Note: The patient has little information regarding the paternal side).  GYNECOLOGIC HISTORY:  No LMP recorded. Patient is not currently having periods (Reason: IUD). Menarche age 66, first live birth age 81. The patient is GX P1. She is having regular periods, the most recent 08/27/2015. Her periods occur every 28 days, and last about 7 days, of which only the first is heavy. 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 Her husband [name]") works in  an apartment maintenance. Their daughter [name], 51 years old, is in high  school. The patient attends a local [employer]: Not in place   HEALTH MAINTENANCE: Social History   Tobacco Use  . Smoking status: Never Smoker  . Smokeless tobacco: Never Used  Substance Use Topics  . Alcohol use: No  . Drug use: No     Colonoscopy:  PAP: UTD  Bone density:  Lipid panel:  Allergies  Allergen Reactions  . Codeine Nausea Only    Current Outpatient Medications  Medication Sig Dispense Refill  . diphenhydrAMINE (BENADRYL) 25 MG tablet Take 1 tablet (25 mg total) by mouth at bedtime as needed. 30 tablet 0  . etanercept (ENBREL) 25 MG injection Inject 25 mg into the skin once a week.    Marland Kitchen levonorgestrel (MIRENA) 20 MCG/24HR IUD 1 each by Intrauterine route once.    . meloxicam (MOBIC) 15 MG tablet Take 15 mg by mouth daily.    . nitroGLYCERIN (NITRODUR - DOSED IN MG/24 HR) 0.2 mg/hr patch Use 1/4 patch daily to the affected area 30 patch 1  . tamoxifen (NOLVADEX) 20 MG tablet TAKE ONE (1) TABLET BY MOUTH EVERY DAY 90 tablet 12  . valACYclovir (VALTREX) 1000 MG tablet   5  . zolpidem (AMBIEN CR) 6.25 MG CR tablet Take 1 tablet (6.25 mg total) by mouth at bedtime as needed for sleep. 30 tablet 0   No current facility-administered medications for this visit.     OBJECTIVE: Middle-aged white woman who appears well  Vitals:   09/15/17 1121  BP: 134/76  Pulse: 88  Resp: 20  Temp: 98.1 F (36.7 C)  SpO2: 100%     Body mass index is 28.65 kg/m.    ECOG FS:0 - Asymptomatic  Sclerae unicteric, pupils round and equal Oropharynx clear and moist No cervical or supraclavicular adenopathy Lungs no rales or rhonchi Heart regular rate and rhythm Abd soft, nontender, positive bowel sounds MSK no focal spinal tenderness, no upper extremity lymphedema Neuro: nonfocal, well oriented, appropriate affect Breasts: The right breast is status post lumpectomy.  There is no evidence of active disease.  The cosmetic result is excellent.  The left  breast is unremarkable.  Both axillae are benign.  LAB RESULTS:  CMP     Component Value Date/Time   NA 141 09/02/2015 1607   K 4.4 09/02/2015 1607   CO2 27 09/02/2015 1607   GLUCOSE 89 09/02/2015 1607   BUN 16.1 09/02/2015 1607   CREATININE 0.8 09/02/2015 1607   CALCIUM 9.1 09/02/2015 1607   PROT 6.9 09/02/2015 1607   ALBUMIN 3.7 09/02/2015 1607   AST 15 09/02/2015 1607   ALT 14 09/02/2015 1607   ALKPHOS 66 09/02/2015 1607   BILITOT 0.49 09/02/2015 1607    INo results found for: SPEP, UPEP  Lab Results  Component Value Date   WBC 7.1 09/02/2015   NEUTROABS 3.2 09/02/2015   HGB 13.3 09/02/2015   HCT 40.5 09/02/2015   MCV 94.5 09/02/2015   PLT 300 09/02/2015      Chemistry      Component Value Date/Time   NA 141 09/02/2015 1607   K 4.4 09/02/2015 1607   CO2 27 09/02/2015 1607   BUN 16.1 09/02/2015 1607   CREATININE 0.8 09/02/2015 1607      Component Value Date/Time   CALCIUM 9.1 09/02/2015 1607   ALKPHOS 66 09/02/2015 1607   AST 15 09/02/2015 1607   ALT 14 09/02/2015  1607   BILITOT 0.49 09/02/2015 1607       No results found for: LABCA2  No components found for: LABCA125  No results for input(s): INR in the last 168 hours.  Urinalysis No results found for: COLORURINE, APPEARANCEUR, LABSPEC, PHURINE, GLUCOSEU, HGBUR, BILIRUBINUR, KETONESUR, PROTEINUR, UROBILINOGEN, NITRITE, LEUKOCYTESUR  STUDIES: Bilateral breast Mammogram with TOMO, 06/06/2017, breast density category C, shows no evidence of malignancy.   ASSESSMENT: 46 y.o. [employer], [place] woman status post right breast lower outer quadrant lumpectomy 07/15/2015 for an intraductal papilloma with atypical ductal hyperplasia  (a) started tamoxifen for risk reduction November 2016  (b) Mirena IUD placed November 2016  PLAN Alexandra Carlson is now 2 years into her 5 years of tamoxifen.  She is tolerating this well.  The plan is to continue to a total of 5 years.  Once she goes off the tamoxifen she might  consider switching to a different form of contraception but while on tamoxifen the Mirena is very useful as it decreases the risk of endometrial hyperplasia and polyps.  I do not have a simple solution for her plantar fasciitis but did suggest she could consider a stationary bike or similar so she does not become deconditioned.  She knows to call for any other issues that may develop before the next visit here. Sukhmani Fetherolf, Virgie Dad, MD  09/15/17 11:45 AM Medical Oncology and Hematology [facility] [address] [place], [place] [address] Tel. [phone]    Fax. [phone]  This document serves as a record of services personally performed by Chauncey Cruel, MD. It was created on his behalf by Margit Banda, a trained medical scribe. The creation of this record is based on the scribe's personal observations and the provider's statements to them.   I have reviewed the above documentation for accuracy and completeness, and I agree with the above.

## 2017-09-15 ENCOUNTER — Ambulatory Visit (HOSPITAL_BASED_OUTPATIENT_CLINIC_OR_DEPARTMENT_OTHER): Payer: PRIVATE HEALTH INSURANCE | Admitting: Oncology

## 2017-09-15 ENCOUNTER — Telehealth: Payer: Self-pay | Admitting: Oncology

## 2017-09-15 VITALS — BP 134/76 | HR 88 | Temp 98.1°F | Resp 20 | Ht 66.0 in | Wt 177.5 lb

## 2017-09-15 DIAGNOSIS — M722 Plantar fascial fibromatosis: Secondary | ICD-10-CM | POA: Diagnosis not present

## 2017-09-15 DIAGNOSIS — Z1239 Encounter for other screening for malignant neoplasm of breast: Secondary | ICD-10-CM

## 2017-09-15 DIAGNOSIS — N951 Menopausal and female climacteric states: Secondary | ICD-10-CM

## 2017-09-15 DIAGNOSIS — N6091 Unspecified benign mammary dysplasia of right breast: Secondary | ICD-10-CM

## 2017-09-15 MED ORDER — TAMOXIFEN CITRATE 20 MG PO TABS: 20.0000 mg | ORAL_TABLET | Freq: Every day | ORAL | 12 refills | Status: DC

## 2017-09-15 NOTE — Telephone Encounter (Signed)
Gave patient avs and calendar with appts per 11/15 los.  °

## 2017-09-27 ENCOUNTER — Ambulatory Visit (INDEPENDENT_AMBULATORY_CARE_PROVIDER_SITE_OTHER): Payer: PRIVATE HEALTH INSURANCE | Admitting: Sports Medicine

## 2017-09-27 DIAGNOSIS — M7662 Achilles tendinitis, left leg: Secondary | ICD-10-CM

## 2017-09-27 NOTE — Progress Notes (Signed)
   Subjective:    Patient ID: Alexandra Carlson, female    DOB: February 08, 1971, 46 y.o.   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 She continues to do better with her pain. She was wearing her heel lift but recently switched tennis shoes and requests new inserts with heel lifts.  She continues using her nitroglycerin patches. She has been on the nitroglycerin for 6 months now. She is doing her eccentric heel drop exercises most days. She has not returned to any running. She is pleased with her progress thus far. She reports having no limitation in the distance she walks and really only has pain when she "wears the wrong shoes".       Review of Systems As per HPI    Objective:   Physical Exam  Well-developed, well-nourished. No acute distress.  left heel: There is still prominence of the distal Achilles tendon secondary to a small Haglund's deformity. There is no tenderness to palpation here. No pain with Achilles stretch. No soft tissue swelling. Neurovascularly intact distally. Walking without a limp.      Assessment & PlanI:  1.  Achilles tendinopathy, left heel: pain continues to improve, pt not limited in her daily activities.  She has not tried to [employer] running yet.    -repeat ultrasound shows improvement in original area of insult as compared to original ultrasound, now with more uniform fibrous tissue.   -Discussed trying to wean pt off nitro patches -gave pt new inserts for new shoes with heel lifts -pt can advance activity as tolerated -pt can follow up with [place] on an as needed basis  Patient seen and evaluated with the resident. I agree with the above plan of care. Patient continues to clinically improve. She has been on her nitroglycerin patches for 6 months. I would like for her to try to wean off of those. She will continue with her eccentric exercises and may continue to increase activity as  tolerated. I did give her a new pair of green sports insoles with a heel lift to go in her new walking shoes. Follow-up for ongoing or recalcitrant issues.

## 2018-04-18 ENCOUNTER — Other Ambulatory Visit: Payer: Self-pay | Admitting: Nurse Practitioner

## 2018-04-18 ENCOUNTER — Other Ambulatory Visit (HOSPITAL_COMMUNITY)
Admission: RE | Admit: 2018-04-18 | Discharge: 2018-04-18 | Disposition: A | Discharge status: Home / Self Care | Payer: PRIVATE HEALTH INSURANCE | Source: Ambulatory Visit | Attending: Nurse Practitioner | Admitting: Nurse Practitioner

## 2018-04-18 DIAGNOSIS — Z01419 Encounter for gynecological examination (general) (routine) without abnormal findings: Secondary | ICD-10-CM | POA: Insufficient documentation

## 2018-04-19 LAB — CYTOLOGY - PAP
Diagnosis: NEGATIVE
HPV: NOT DETECTED

## 2018-07-25 ENCOUNTER — Other Ambulatory Visit: Payer: Self-pay | Admitting: Family Medicine

## 2018-07-25 DIAGNOSIS — Z1231 Encounter for screening mammogram for malignant neoplasm of breast: Secondary | ICD-10-CM

## 2018-08-03 ENCOUNTER — Ambulatory Visit
Admission: RE | Admit: 2018-08-03 | Discharge: 2018-08-03 | Disposition: A | Discharge status: Home / Self Care | Payer: PRIVATE HEALTH INSURANCE | Source: Ambulatory Visit

## 2018-08-03 DIAGNOSIS — Z1231 Encounter for screening mammogram for malignant neoplasm of breast: Secondary | ICD-10-CM

## 2018-09-19 ENCOUNTER — Telehealth: Payer: Self-pay | Admitting: Oncology

## 2018-09-19 ENCOUNTER — Inpatient Hospital Stay: Payer: PRIVATE HEALTH INSURANCE | Attending: Oncology | Admitting: Oncology

## 2018-09-19 VITALS — BP 125/79 | HR 83 | Temp 98.2°F | Resp 18 | Ht 66.0 in | Wt 192.4 lb

## 2018-09-19 DIAGNOSIS — N6091 Unspecified benign mammary dysplasia of right breast: Secondary | ICD-10-CM | POA: Diagnosis present

## 2018-09-19 DIAGNOSIS — L4050 Arthropathic psoriasis, unspecified: Secondary | ICD-10-CM

## 2018-09-19 DIAGNOSIS — Z7984 Long term (current) use of oral hypoglycemic drugs: Secondary | ICD-10-CM | POA: Diagnosis not present

## 2018-09-19 DIAGNOSIS — Z79899 Other long term (current) drug therapy: Secondary | ICD-10-CM

## 2018-09-19 DIAGNOSIS — Z79810 Long term (current) use of selective estrogen receptor modulators (SERMs): Secondary | ICD-10-CM | POA: Insufficient documentation

## 2018-09-19 DIAGNOSIS — Z1239 Encounter for other screening for malignant neoplasm of breast: Secondary | ICD-10-CM

## 2018-09-19 MED ORDER — TAMOXIFEN CITRATE 20 MG PO TABS: 20.0000 mg | ORAL_TABLET | Freq: Every day | ORAL | 12 refills | Status: DC

## 2018-09-19 NOTE — Progress Notes (Signed)
[place]  Telephone:(336) [phone] Fax:(336) [phone]     ID: Alexandra Carlson DOB: [birth year]  MR#: [id number]  JIR#:[id number]  Patient Care Team: Gaynelle Arabian, MD as PCP - General (Family Medicine) Thongteum, Maryjo Rochester, NP as Nurse Practitioner (Nurse Practitioner) Fanny Skates, MD as Consulting Physician (General Surgery) Brodrick Curran, Virgie Dad, MD as Consulting Physician (Oncology) Gavin Pound, MD as Consulting Physician (Rheumatology) Druscilla Brownie, MD as Consulting Physician (Dermatology) Thurman Coyer, DO as Consulting Physician (Sports Medicine) OTHER MD:  CHIEF COMPLAINT: atypical ductal hyperplasia  CURRENT TREATMENT: tamoxifen   BREAST CANCER HISTORY: From the original intake note:  Alexandra Carlson had routine screening bilateral mammography with tomography 06/02/2015 at the breast Center, showing some calcifications in the right breast. On 08/06/2015 she underwent diagnostic right mammography. This found the breast density to be category C. In the lower outer quadrant of the right breast there was a group of punctate and pleomorphic calcifications measuring 1.5 cm. Stereotactic biopsy of this area was recommended and performed 06/10/2015. This showed ([id number]) a ductal papilloma with atypical ductal hyperplasia.  Accordingly the patient was referred to surgery and on 07/15/2015 underwent right lumpectomy. The pathology from this procedure (SZA [id number]) showed an intraductal papilloma with atypical ductal hyperplasia. There was no evidence of invasive disease.  The patient's subsequent history is as detailed below  INTERVAL HISTORY: Alexandra Carlson returns today for 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  She continues on tamoxifen, which she tolerates well. She denies hot flashes and vaginal dryness.   Since her last visit, she underwent bilateral mammography on 08/03/2018.  Breast density was category C.  This showed no evidence of  malignancy.   REVIEW OF SYSTEMS: Alexandra Carlson reports working a lot. She continues to have issues with her foot (left achilles tendon injury) and will look into meeting with podietry. She endorses taking trazedone, which she notes does help with her sleep. She has never had a bone density test performed. Pap smear performed on 04/18/2018 was normal. The patient denies unusual headaches, visual changes, nausea, vomiting, stiff neck, dizziness, or gait imbalance. There has been no cough, phlegm production, or pleurisy, no chest pain or pressure, and no change in bowel or bladder habits. The patient denies fever, rash, bleeding, unexplained fatigue or unexplained weight loss. A detailed review of systems was otherwise entirely negative.   PAST MEDICAL HISTORY: Past Medical History:  Diagnosis Date  . Arthritis    psoriatic arthritis  . Atypical ductal hyperplasia of breast    right  . Migraines, neuralgic     PAST SURGICAL HISTORY: Past Surgical History:  Procedure Laterality Date  . BREAST BIOPSY Right 06/19/2012  . BREAST EXCISIONAL BIOPSY Right 07/2012  . BREAST LUMPECTOMY WITH RADIOACTIVE SEED LOCALIZATION Right 07/15/2015   Procedure: BREAST LUMPECTOMY WITH RADIOACTIVE SEED LOCALIZATION;  Surgeon: Fanny Skates, MD;  Location: [place];  Service: General;  Laterality: Right;  . KNEE ARTHROSCOPY Right   . RHINOPLASTY      FAMILY HISTORY Family History  Problem Relation Age of Onset  . Breast cancer Neg Hx    The patient's parents are currently in their early 73s. The patient has one brother, no sisters. There is no history of breast or ovarian cancer in the family. (Note: The patient has little information regarding the paternal side).  GYNECOLOGIC HISTORY:  No LMP recorded. (Menstrual status: IUD). Menarche age 65, first live birth age 52. The patient is GX P1. She is having regular periods, the most recent  08/27/2015. Her periods occur every 28 days, and last about  7 days, of which only the first is heavy. 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 Their daughter [name], 83 years old, is in high school. The patient attends a local [employer]: Not in place   HEALTH MAINTENANCE: Social History   Tobacco Use  . Smoking status: Never Smoker  . Smokeless tobacco: Never Used  Substance Use Topics  . Alcohol use: No  . Drug use: No     Colonoscopy:  PAP: 04/18/2018-- normal  Bone density:  Lipid panel:  Allergies  Allergen Reactions  . Codeine Nausea Only    Current Outpatient Medications  Medication Sig Dispense Refill  . etanercept (ENBREL) 25 MG injection Inject 25 mg into the skin once a week.    Marland Kitchen levonorgestrel (MIRENA) 20 MCG/24HR IUD 1 each by Intrauterine route once.    . meloxicam (MOBIC) 15 MG tablet Take 15 mg by mouth daily.    . tamoxifen (NOLVADEX) 20 MG tablet Take 1 tablet (20 mg total) by mouth daily. 90 tablet 12  . valACYclovir (VALTREX) 1000 MG tablet   5   No current facility-administered medications for this visit.     OBJECTIVE: Middle-aged white woman in no acute distress  Vitals:   09/19/18 1127  BP: 125/79  Pulse: 83  Resp: 18  Temp: 98.2 F (36.8 C)  SpO2: 99%     Body mass index is 31.05 kg/m.    ECOG FS:0 - Asymptomatic  Sclerae unicteric, EOMs intact Oropharynx clear and moist No cervical or supraclavicular adenopathy Lungs no rales or rhonchi Heart regular rate and rhythm Abd soft, nontender, positive bowel sounds MSK no focal spinal tenderness, no upper extremity lymphedema Neuro: nonfocal, well oriented, appropriate affect Breasts: The right breast is status post lumpectomy with no evidence of disease activity.  The left breast is benign.  Both axillae are benign  LAB RESULTS:  CMP     Component  Value Date/Time   NA 141 09/02/2015 1607   K 4.4 09/02/2015 1607   CO2 27 09/02/2015 1607   GLUCOSE 89 09/02/2015 1607   BUN 16.1 09/02/2015 1607   CREATININE 0.8 09/02/2015 1607   CALCIUM 9.1 09/02/2015 1607   PROT 6.9 09/02/2015 1607   ALBUMIN 3.7 09/02/2015 1607   AST 15 09/02/2015 1607   ALT 14 09/02/2015 1607   ALKPHOS 66 09/02/2015 1607   BILITOT 0.49 09/02/2015 1607    INo results found for: SPEP, UPEP  Lab Results  Component Value Date   WBC 7.1 09/02/2015   NEUTROABS 3.2 09/02/2015   HGB 13.3 09/02/2015   HCT 40.5 09/02/2015   MCV 94.5 09/02/2015   PLT 300 09/02/2015      Chemistry      Component Value Date/Time   NA 141 09/02/2015 1607   K 4.4 09/02/2015 1607   CO2 27 09/02/2015 1607   BUN 16.1 09/02/2015 1607   CREATININE 0.8 09/02/2015 1607      Component Value Date/Time   CALCIUM 9.1 09/02/2015 1607   ALKPHOS 66 09/02/2015 1607   AST 15 09/02/2015 1607   ALT 14 09/02/2015 1607   BILITOT 0.49 09/02/2015 1607       No results found for: LABCA2  No components found for: LABCA125  No  results for input(s): INR in the last 168 hours.  Urinalysis No results found for: COLORURINE, APPEARANCEUR, LABSPEC, PHURINE, GLUCOSEU, HGBUR, BILIRUBINUR, KETONESUR, PROTEINUR, UROBILINOGEN, NITRITE, LEUKOCYTESUR  STUDIES: Mammography results discussed with the patient  ASSESSMENT: 47 y.o. [employer], [place] woman status post right breast lower outer quadrant lumpectomy 07/15/2015 for an intraductal papilloma with atypical ductal hyperplasia  (a) started tamoxifen for risk reduction November 2016  (b) Mirena IUD placed November 2016  PLAN Marquel is now 3 years into tamoxifen for risk reduction.  She is tolerating it quite well.  The plan will be to continue that for a total of 5 years.  She gets a second breast exam yearly at her gynecologist in May.  She is using the Mirena IUD concurrently, which is optimal  Today we discussed diet and exercise issues.   I recommended 45 minutes of exercise 5 days a week and a diet low on carbohydrates.  She will see me again in 1 year.  She knows to call for any other issues that may develop before that visit.  Carney Saxton, Virgie Dad, MD  09/19/18 11:47 AM Medical Oncology and Hematology [facility] [address] [place], [place] [address] Tel. [phone]    Fax. [phone]  I, Wilburn Mylar am acting as scribe for Dr. Virgie Dad. Jerran Tappan.  I, Lurline Del MD, have reviewed the above documentation for accuracy and completeness, and I agree with the above.

## 2018-09-19 NOTE — Telephone Encounter (Signed)
Gave patient avs and calendar.   °

## 2019-02-27 MED FILL — MELOXICAM 15 MG TABLET: 15 | 90 days supply | Qty: 90 | Fill #0

## 2019-02-27 MED FILL — valACYclovir HCL 1 GM TABS: 1 | 8 days supply | Qty: 8 | Fill #0

## 2019-04-10 MED FILL — traZODone HCL 50 MG TABS: 50 | 30 days supply | Qty: 90 | Fill #0

## 2019-04-10 MED FILL — TAMOXIFEN CITRATE 20 MG TAB: 20 | 90 days supply | Qty: 90 | Fill #0

## 2019-04-10 MED FILL — valACYclovir HCL 1 GM TABS: 1 | 8 days supply | Qty: 8 | Fill #1

## 2019-04-27 MED FILL — valACYclovir HCL 1 GM TABS: 1 | 8 days supply | Qty: 8 | Fill #2

## 2019-05-27 MED FILL — MELOXICAM 15 MG TABLET: 15 | 90 days supply | Qty: 90 | Fill #1

## 2019-08-14 ENCOUNTER — Other Ambulatory Visit: Payer: Self-pay | Admitting: Family Medicine

## 2019-08-14 DIAGNOSIS — Z1231 Encounter for screening mammogram for malignant neoplasm of breast: Secondary | ICD-10-CM

## 2019-08-24 ENCOUNTER — Other Ambulatory Visit: Payer: Self-pay

## 2019-08-24 ENCOUNTER — Ambulatory Visit
Admission: RE | Admit: 2019-08-24 | Discharge: 2019-08-24 | Disposition: A | Discharge status: Home / Self Care | Payer: PRIVATE HEALTH INSURANCE | Source: Ambulatory Visit | Attending: Family Medicine | Admitting: Family Medicine

## 2019-08-24 DIAGNOSIS — Z1231 Encounter for screening mammogram for malignant neoplasm of breast: Secondary | ICD-10-CM

## 2019-09-24 NOTE — Progress Notes (Signed)
No show  GM

## 2019-09-25 ENCOUNTER — Inpatient Hospital Stay: Payer: PRIVATE HEALTH INSURANCE | Attending: Oncology | Admitting: Oncology

## 2019-09-25 ENCOUNTER — Telehealth: Payer: Self-pay | Admitting: Oncology

## 2019-09-25 ENCOUNTER — Encounter: Payer: Self-pay | Admitting: Oncology

## 2019-09-25 DIAGNOSIS — Z1239 Encounter for other screening for malignant neoplasm of breast: Secondary | ICD-10-CM

## 2019-09-25 DIAGNOSIS — N6091 Unspecified benign mammary dysplasia of right breast: Secondary | ICD-10-CM

## 2019-09-25 NOTE — Telephone Encounter (Signed)
Returned patient's phone call regarding rescheduling an appointment, left a voicemail. 

## 2019-10-05 ENCOUNTER — Telehealth: Payer: Self-pay | Admitting: Oncology

## 2019-10-05 NOTE — Telephone Encounter (Signed)
Returned patient's phone call regarding rescheduling 11/24 missed appointment, per patient's request appointment has been rescheduled to 12/07.

## 2019-10-07 NOTE — Progress Notes (Signed)
[place]  [phone]) [phone] [phone]) [phone]     ID: [name] DOB: [birth year]  MR#: [id number]  [id number]  Patient Care Team: Gaynelle Arabian, MD as PCP - General (Family Medicine) Thongteum, Maryjo Rochester, NP as Nurse Practitioner (Nurse Practitioner) Fanny Skates, MD as Consulting Physician (General Surgery) Magrinat, Virgie Dad, MD as Consulting Physician (Oncology) Gavin Pound, MD as Consulting Physician (Rheumatology) Druscilla Brownie, MD as Consulting Physician (Dermatology) Thurman Coyer, DO as Consulting Physician (Sports Medicine) OTHER MD:  CHIEF COMPLAINT: atypical ductal hyperplasia  CURRENT TREATMENT: tamoxifen   INTERVAL HISTORY: Alexandra Carlson returns today for 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    She continues on tamoxifen, for breast cancer prevention.  She tolerates this well.  She has not had problems with hot flashes or vaginal discharge.  Recall she has a Mirena in place so she is not having any periods.  Since her last visit, she underwent bilateral screening mammography with tomography at [facility] on 08/24/2019 showing: breast density category C; no evidence of malignancy in either breast.   REVIEW OF SYSTEMS: Alexandra Carlson continues to work full-time.  They are taking appropriate pandemic precautions.  For exercise she runs about 3 times a week.  A detailed review of systems today was otherwise noncontributory   BREAST CANCER HISTORY: From the original intake note:  Alexandra Carlson had routine screening bilateral mammography with tomography 06/02/2015 [facility], showing some calcifications in the right breast. On 08/06/2015 she underwent diagnostic right mammography. This found the breast density to be category C. In the lower outer quadrant of the right breast there was a group of punctate and pleomorphic calcifications measuring 1.5 cm. Stereotactic biopsy of this area was recommended and  performed 06/10/2015. This showed (SAA [id number]) a ductal papilloma with atypical ductal hyperplasia.  Accordingly the patient was referred to surgery and on 07/15/2015 underwent right lumpectomy. The pathology from this procedure (SZA [id number]) showed an intraductal papilloma with atypical ductal hyperplasia. There was no evidence of invasive disease.  The patient's subsequent history is as detailed below   PAST MEDICAL HISTORY: Past Medical History:  Diagnosis Date  . Arthritis    psoriatic arthritis  . Atypical ductal hyperplasia of breast    right  . Migraines, neuralgic     PAST SURGICAL HISTORY: Past Surgical History:  Procedure Laterality Date  . BREAST BIOPSY Right 06/19/2012  . BREAST EXCISIONAL BIOPSY Right 07/2012  . BREAST LUMPECTOMY WITH RADIOACTIVE SEED LOCALIZATION Right 07/15/2015   Procedure: BREAST LUMPECTOMY WITH RADIOACTIVE SEED LOCALIZATION;  Surgeon: Fanny Skates, MD;  Location: [facility];  Service: General;  Laterality: Right;  . KNEE ARTHROSCOPY Right   . RHINOPLASTY      FAMILY HISTORY Family History  Problem Relation Age of Onset  . Breast cancer Neg Hx    The patient's parents are currently in their early 18s. The patient has one brother, no sisters. There is no history of breast or ovarian cancer in the family. (Note: The patient has little information regarding the paternal side).   GYNECOLOGIC HISTORY:  No LMP recorded. (Menstrual status: IUD). Menarche age 82, first live birth age 41. The patient is GX P1. She is having regular periods, the most recent 08/27/2015. Her periods occur every 28 days, and last about 7 days, of which only the first is heavy. 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 Her husband [name]") works in apartment maintenance. Their daughter [name], 96 years old, is in high school. The patient attends  a local [place] DIRECTIVES: In the absence of any documentation to the contrary, the patient's spouse is their HCPOA.   HEALTH MAINTENANCE: Social History   Tobacco Use  . Smoking status: Never Smoker  . Smokeless tobacco: Never Used  Substance Use Topics  . Alcohol use: No  . Drug use: No     Colonoscopy:  PAP: 04/18/2018-- normal  Bone density:  Lipid panel:  Allergies  Allergen Reactions  . Codeine Nausea Only    Current Outpatient Medications  Medication Sig Dispense Refill  . etanercept (ENBREL) 25 MG injection Inject 25 mg into the skin once a week.    Marland Kitchen levonorgestrel (MIRENA) 20 MCG/24HR IUD 1 each by Intrauterine route once.    . meloxicam (MOBIC) 15 MG tablet Take 15 mg by mouth daily.    . tamoxifen (NOLVADEX) 20 MG tablet Take 1 tablet (20 mg total) by mouth daily. 90 tablet 12  . valACYclovir (VALTREX) 1000 MG tablet   5   No current facility-administered medications for this visit.     OBJECTIVE: Middle-aged white woman who appears well  Vitals:   10/08/19 1302  BP: 135/71  Pulse: 83  Resp: 18  Temp: 98.2 F (36.8 C)  SpO2: 100%     Body mass index is 29.63 kg/m.    ECOG FS:0 - Asymptomatic  Sclerae unicteric, EOMs intact Wearing a mask No cervical or supraclavicular adenopathy Lungs no rales or rhonchi Heart regular rate and rhythm Abd soft, nontender, positive bowel sounds MSK no focal spinal tenderness, no upper extremity lymphedema Neuro: nonfocal, well oriented, appropriate affect Breasts: The right breast is status post lumpectomy.  There are no suspicious findings.  The left breast is benign.  Both axillae are benign.   LAB RESULTS:  CMP     Component Value Date/Time   NA 141 09/02/2015 1607   K 4.4 09/02/2015 1607   CO2 27 09/02/2015 1607   GLUCOSE 89 09/02/2015 1607   BUN 16.1 09/02/2015 1607   CREATININE 0.8 09/02/2015 1607   CALCIUM 9.1 09/02/2015 1607   PROT 6.9 09/02/2015 1607   ALBUMIN 3.7  09/02/2015 1607   AST 15 09/02/2015 1607   ALT 14 09/02/2015 1607   ALKPHOS 66 09/02/2015 1607   BILITOT 0.49 09/02/2015 1607    INo results found for: SPEP, UPEP  Lab Results  Component Value Date   WBC 7.1 09/02/2015   NEUTROABS 3.2 09/02/2015   HGB 13.3 09/02/2015   HCT 40.5 09/02/2015   MCV 94.5 09/02/2015   PLT 300 09/02/2015      Chemistry      Component Value Date/Time   NA 141 09/02/2015 1607   K 4.4 09/02/2015 1607   CO2 27 09/02/2015 1607   BUN 16.1 09/02/2015 1607   CREATININE 0.8 09/02/2015 1607      Component Value Date/Time   CALCIUM 9.1 09/02/2015 1607   ALKPHOS 66 09/02/2015 1607   AST 15 09/02/2015 1607   ALT 14 09/02/2015 1607   BILITOT 0.49 09/02/2015 1607       No results found for: LABCA2  No components found for: LABCA125  No results for input(s): INR in the last 168 hours.  Urinalysis No results found for: COLORURINE, APPEARANCEUR, LABSPEC, PHURINE, GLUCOSEU, HGBUR, BILIRUBINUR, KETONESUR, PROTEINUR, UROBILINOGEN, NITRITE, LEUKOCYTESUR   STUDIES: No results found.  ASSESSMENT: 48 y.o. [employer], [place] woman status post right breast lower outer quadrant lumpectomy 07/15/2015 for an intraductal papilloma with atypical ductal hyperplasia  (a) started tamoxifen for risk reduction November 2016  (b) Mirena IUD placed November 2016  PLAN Alexandra Carlson is now 4 years out from the start of tamoxifen.  She is taking this prophylactically.  The plan is for a total of 5 years and that medication  She has an excellent exercise program.  She is keeping appropriate pandemic precautions  We discussed her mammogram which is still category C density.  That will improve with time and also with tamoxifen.  She will have repeat mammography in a year and see me again at that time  She knows to call for any other issue that may develop before then.  Magrinat, Virgie Dad, MD  10/08/19 1:07 PM Medical Oncology and Hematology [facility] [place], [place] [address] Tel. [phone]    Fax. [phone]   I, Wilburn Mylar am acting as scribe for Dr. Virgie Dad. Magrinat.  I, Lurline Del MD, have reviewed the above documentation for accuracy and completeness, and I agree with the above.

## 2019-10-08 ENCOUNTER — Inpatient Hospital Stay: Payer: PRIVATE HEALTH INSURANCE | Attending: Oncology | Admitting: Oncology

## 2019-10-08 ENCOUNTER — Other Ambulatory Visit: Payer: Self-pay

## 2019-10-08 VITALS — BP 135/71 | HR 83 | Temp 98.2°F | Resp 18 | Ht 66.0 in | Wt 183.6 lb

## 2019-10-08 DIAGNOSIS — N6091 Unspecified benign mammary dysplasia of right breast: Secondary | ICD-10-CM | POA: Diagnosis not present

## 2019-10-08 DIAGNOSIS — Z79899 Other long term (current) drug therapy: Secondary | ICD-10-CM | POA: Insufficient documentation

## 2019-10-08 DIAGNOSIS — Z79810 Long term (current) use of selective estrogen receptor modulators (SERMs): Secondary | ICD-10-CM | POA: Insufficient documentation

## 2019-10-08 DIAGNOSIS — M129 Arthropathy, unspecified: Secondary | ICD-10-CM | POA: Insufficient documentation

## 2019-10-08 DIAGNOSIS — Z1239 Encounter for other screening for malignant neoplasm of breast: Secondary | ICD-10-CM

## 2019-10-08 MED ORDER — TAMOXIFEN CITRATE 20 MG PO TABS: 20.0000 mg | ORAL_TABLET | Freq: Every day | ORAL | 4 refills | Status: DC

## 2019-10-09 ENCOUNTER — Telehealth: Payer: Self-pay | Admitting: Oncology

## 2019-10-09 NOTE — Telephone Encounter (Signed)
I left a message regarding schedule  

## 2020-03-27 ENCOUNTER — Ambulatory Visit (INDEPENDENT_AMBULATORY_CARE_PROVIDER_SITE_OTHER): Payer: PRIVATE HEALTH INSURANCE | Admitting: Sports Medicine

## 2020-03-27 ENCOUNTER — Other Ambulatory Visit: Payer: Self-pay

## 2020-03-27 ENCOUNTER — Ambulatory Visit
Admission: RE | Admit: 2020-03-27 | Discharge: 2020-03-27 | Disposition: A | Discharge status: Home / Self Care | Payer: PRIVATE HEALTH INSURANCE | Source: Ambulatory Visit | Attending: Sports Medicine | Admitting: Sports Medicine

## 2020-03-27 VITALS — BP 122/84 | Ht 66.0 in | Wt 184.0 lb

## 2020-03-27 DIAGNOSIS — M25561 Pain in right knee: Secondary | ICD-10-CM | POA: Diagnosis not present

## 2020-03-28 NOTE — Progress Notes (Addendum)
   Subjective:    Patient ID: Alexandra Carlson, female    DOB: 1970/11/22, 49 y.o.   MRN: [id number]  HPI chief complaint: Right knee pain  [name] comes in today complaining of 4 weeks of right knee pain.  She started a new running program 8 weeks ago.  She runs 3 days a week.  4 weeks into that program she began to experience medial sided knee pain with running.  She does describe an achy discomfort along the medial knee but did have an episode of sharp pain as well.  Despite her pain she has continued to try to run.  She denies any swelling but does endorse stiffness and a tight feeling in the knee.  No locking, catching, or popping.  No feelings of instability.  She is on Mobic chronically but that was recently switched to Celebrex for her knee pain but she has not noticed much of a difference with this change.  She has also treated her knee with a compression sleeve as well as icing.  She has a remote history of a meniscectomy many years ago but has done well with the knee postoperatively up until now.  Past medical history reviewed Medications reviewed Allergies reviewed    Review of Systems As above    Objective:   Physical Exam  Well-developed, well-nourished.  No acute distress.  Awake alert and oriented x3.  Vital signs reviewed.  Right knee: Full range of motion.  No obvious effusion.  She is tender to palpation along the medial joint line with a positive Thessaly's.  No tenderness along the lateral joint line.  Knee is stable to valgus and varus stressing.  Negative anterior drawer, negative posterior drawer.  No significant patellofemoral crepitus.  Neurovascularly intact distally.  X-rays of the right knee including AP, lateral, and sunrise views show some mild arthritic changes in the medial compartment.  There is sclerosis in this area along with some minimal joint space narrowing.  Remainder of the knee is unremarkable.       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  She will start some isometric quad and hip strengthening exercises and follow-up with me in 2 weeks.  Since she is not getting any relief with her Celebrex she may want to consider switching back to her meloxicam but I would like for her to discuss this with her PCP first.  If symptoms persist at follow up, consider a limited ultrasound of the right knee and an intra-articular cortisone injection.

## 2020-04-15 ENCOUNTER — Other Ambulatory Visit: Payer: Self-pay

## 2020-04-15 ENCOUNTER — Ambulatory Visit (INDEPENDENT_AMBULATORY_CARE_PROVIDER_SITE_OTHER): Payer: PRIVATE HEALTH INSURANCE | Admitting: Sports Medicine

## 2020-04-15 DIAGNOSIS — M25561 Pain in right knee: Secondary | ICD-10-CM | POA: Diagnosis not present

## 2020-04-15 NOTE — Progress Notes (Signed)
   Alexandra Carlson is a 49 y.o. female who presents to [facility] today for the following:  Right knee pain Patient presented for follow-up of right knee pain.  States that she initially hurt her knee while running.  9 to 10 weeks ago she started a more intensive running routine.  4 weeks ago her knee started to hurt.  She saw Dr. Micheline Chapman 2 weeks ago who told her to take some time off and rest.  States that since resting her pain is now 75% better.  States that she was even able to run 3 times since being told to rest.  Usually uses a compression brace when running which helps some but she states it feels very hot when she runs.  Reports after running her knee feels stiff and has some pain but it is not as bad as previously.  Has been using Celebrex and ice which helps.  Denies any locking.  Denies any instability.  Denies any edema.  Denies any bruising.  Normally runs on pavement.  PMH reviewed. Non pertinent  ROS as above. Medications reviewed.  Exam:  BP 110/68   Ht 5\' 6"  (1.676 m)   Wt 177 lb (80.3 kg)   BMI 28.57 kg/m  Gen: Well NAD MSK: Knee: - Inspection: no gross deformity. No swelling/effusion, erythema or bruising. Skin intact - Palpation: TTP of medial joint line  - ROM: full active ROM with flexion and extension in knee and hip - Strength: 5/5 strength - Neuro/vasc: NV intact - Special Tests: - LIGAMENTS: negative anterior and posterior drawer, negative Lachman's, no MCL or LCL laxity  -- MENISCUS: negative McMurray's  -- PF JOINT: nml patellar mobility bilaterally.  negative patellar grind, negative patellar apprehension  Hips: normal ROM, negative FABER and FADIR bilaterally  DG Knee AP/LAT W/Sunrise Right  Result Date: 03/28/2020 CLINICAL DATA:  Pain. EXAM: RIGHT KNEE 3 VIEWS COMPARISON:  None. FINDINGS: Standing frontal, standing lateral, and sunrise patellar images were obtained. No fracture or dislocation. There is no joint effusion. Joint spaces appear unremarkable. No  erosive change. IMPRESSION: No evident fracture, dislocation, or joint effusion. No appreciable arthropathy. Electronically Signed   By: Lowella Grip III M.D.   On: 03/28/2020 09:03     Assessment and Plan: 1) Right knee pain Patient presenting with acute right knee pain.  Seems to be improving since resting as per Dr. Alinda Money recommendations it 2 weeks ago.  Given that patient continues to improve advised that she continue current regimen.  Advised orthotic use as this may help as she is running on pavement.  Patient will call her insurance to see if this is covered.  If it is covered she will make an appointment for orthotics.  Advised slowly increasing running routine.  Advised to increase 10 %/week.  Follow-up as needed.  If worsening pain can consider corticosteroid injection but not needed at this time.  Follow-up as needed.   Dalphine Handing, PGY-3 [facility] Resident  04/15/2020 2:45 PM  Patient seen and evaluated with the resident.  I agree with the above plan of care.  I think the patient would benefit from the cushioning that custom orthotics would provide.  She will check with her insurance about coverage.  If her pain once again worsens then we could reconsider merits of a cortisone injection prior to further diagnostic imaging.  Patient will follow up as needed.

## 2020-04-15 NOTE — Assessment & Plan Note (Signed)
Patient presenting with acute right knee pain.  Seems to be improving since resting as per Dr. Alinda Money recommendations it 2 weeks ago.  Given that patient continues to improve advised that she continue current regimen.  Advised orthotic use as this may help as she is running on pavement.  Patient will call her insurance to see if this is covered.  If it is covered she will make an appointment for orthotics.  Advised slowly increasing running routine.  Advised to increase 10 %/week.  Follow-up as needed.  If worsening pain can consider corticosteroid injection but not needed at this time.  Follow-up as needed.

## 2020-04-21 ENCOUNTER — Other Ambulatory Visit (HOSPITAL_COMMUNITY): Payer: Self-pay | Admitting: Family Medicine

## 2020-06-12 ENCOUNTER — Encounter: Payer: Self-pay | Admitting: Cardiovascular Disease

## 2020-06-12 ENCOUNTER — Ambulatory Visit (INDEPENDENT_AMBULATORY_CARE_PROVIDER_SITE_OTHER): Payer: PRIVATE HEALTH INSURANCE | Admitting: Cardiovascular Disease

## 2020-06-12 ENCOUNTER — Other Ambulatory Visit: Payer: Self-pay

## 2020-06-12 VITALS — BP 110/70 | HR 82 | Ht 66.0 in | Wt 178.4 lb

## 2020-06-12 DIAGNOSIS — I493 Ventricular premature depolarization: Secondary | ICD-10-CM | POA: Diagnosis not present

## 2020-06-12 DIAGNOSIS — L4050 Arthropathic psoriasis, unspecified: Secondary | ICD-10-CM

## 2020-06-12 DIAGNOSIS — Z8249 Family history of ischemic heart disease and other diseases of the circulatory system: Secondary | ICD-10-CM

## 2020-06-12 DIAGNOSIS — R002 Palpitations: Secondary | ICD-10-CM | POA: Diagnosis not present

## 2020-06-12 DIAGNOSIS — E7800 Pure hypercholesterolemia, unspecified: Secondary | ICD-10-CM | POA: Diagnosis not present
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
## 2020-06-12 NOTE — Patient Instructions (Addendum)
Medication Instructions:  No changes *If you need a refill on your cardiac medications before your next appointment, please call your pharmacy*   Lab Work: Your provider would like for you to have the following labs: Fasting Lipid  If you have labs (blood work) drawn today and your tests are completely normal, you will receive your results only by: Marland Kitchen MyChart Message (if you have MyChart) OR . A paper copy in the mail If you have any lab test that is abnormal or we need to change your treatment, we will call you to review the results.   Testing/Procedures: Your physician has requested that you have an exercise tolerance test. For further information please visit [email or web address]. Please also follow instruction sheet, as given. This will take place at [place], [address].  Do not drink or eat foods with caffeine for 24 hours before the test. (Chocolate, coffee, tea, or energy drinks)  If you use an inhaler, bring it with you to the test.  Do not smoke for 4 hours before the test.  Wear comfortable shoes and clothing. You will need to have the covid test prior (please fax your results to the number provided)  Your physician has requested that you have an echocardiogram. Echocardiography is a painless test that uses sound waves to create images of your heart. It provides your doctor with information about the size and shape of your heart and how well your heart's chambers and valves are working. You may receive an ultrasound enhancing agent through an IV if needed to better visualize your heart during the echo.This procedure takes approximately one hour. There are no restrictions for this procedure. This will take place at the [address].   Your physician has recommended that you wear a 24 holter monitor. Holter monitors are medical devices that record the heart's electrical activity. Doctors most often use these monitors to diagnose arrhythmias. Arrhythmias are  problems with the speed or rhythm of the heartbeat. The monitor is a small, portable device. You can wear one while you do your normal daily activities. This is usually used to diagnose what is causing palpitations/syncope (passing out).  Follow-Up: At [facility], you and your health needs are our priority.  As part of our continuing mission to provide you with exceptional heart care, we have created designated Provider Care Teams.  These Care Teams include your primary Cardiologist (physician) and Advanced Practice Providers (APPs -  Physician Assistants and Nurse Practitioners) who all work together to provide you with the care you need, when you need it.  We recommend signing up for the patient portal called "MyChart".  Sign up information is provided on this After Visit Summary.  MyChart is used to connect with patients for Virtual Visits (Telemedicine).  Patients are able to view lab/test results, encounter notes, upcoming appointments, etc.  Non-urgent messages can be sent to your provider as well.   To learn more about what you can do with MyChart, go to [email or web address].    Your next appointment:   12 month(s)  The format for your next appointment:   In Person  Provider:   You may see Sanda Klein, MD or one of the following Advanced Practice Providers on your designated Care Team:    Almyra Deforest, PA-C  Fabian Sharp, [place] or   Roby Lofts, [place]  Other Instructions [employer] Cardiac Event Monitor Instructions Your physician has requested you wear your cardiac event monitor for ___1__ day, (1-30). [employer] may  call or text to confirm a shipping address. The monitor will be sent to a land address via UPS. [employer] will not ship a monitor to a PO BOX. It typically takes 3-5 days to receive your monitor after it has been enrolled. [employer] will assist with USPS tracking if your package is delayed. The telephone number for [employer] is [phone]. Once you have  received your monitor, please review the enclosed instructions. Instruction tutorials can also be viewed under help and settings on the enclosed cell phone. Your monitor has already been registered assigning a specific monitor serial # to you.  Applying the monitor Remove cell phone from case and turn it on. The cell phone works as Dealer and needs to be within [employer] of you at all times. The cell phone will need to be charged on a daily basis. We recommend you plug the cell phone into the enclosed charger at your bedside table every night.  Monitor batteries: You will receive two monitor batteries labelled #1 and #2. These are your recorders. Plug battery #2 onto the second connection on the enclosed charger. Keep one battery on the charger at all times. This will keep the monitor battery deactivated. It will also keep it fully charged for when you need to switch your monitor batteries. A small light will be blinking on the battery emblem when it is charging. The light on the battery emblem will remain on when the battery is fully charged.  Open package of a Monitor strip. Insert battery #1 into black hood on strip and gently squeeze monitor battery onto connection as indicated in instruction booklet. Set aside while preparing skin.  Choose location for your strip, vertical or horizontal, as indicated in the instruction booklet. Shave to remove all hair from location. There cannot be any lotions, oils, powders, or colognes on skin where monitor is to be applied. Wipe skin clean with enclosed Saline wipe. Dry skin completely.  Peel paper labeled #1 off the back of the Monitor strip exposing the adhesive. Place the monitor on the chest in the vertical or horizontal position shown in the instruction booklet. One arrow on the monitor strip must be pointing upward. Carefully remove paper labeled #2, attaching remainder of strip to your skin. Try not to create any folds or wrinkles in  the strip as you apply it.  Firmly press and release the circle in the center of the monitor battery. You will hear a small beep. This is turning the monitor battery on. The heart emblem on the monitor battery will light up every 5 seconds if the monitor battery in turned on and connected to the patient securely. Do not push and hold the circle down as this turns the monitor battery off. The cell phone will locate the monitor battery. A screen will appear on the cell phone checking the connection of your monitor strip. This may read poor connection initially but change to good connection within the next minute. Once your monitor accepts the connection you will hear a series of 3 beeps followed by a climbing crescendo of beeps. A screen will appear on the cell phone showing the two monitor strip placement options. Touch the picture that demonstrates where you applied the monitor strip.  Your monitor strip and battery are waterproof. You are able to shower, bathe, or swim with the monitor on. They just ask you do not submerge deeper than 3 feet underwater. We recommend removing the monitor if you are swimming in a lake,  river, or ocean.  Your monitor battery will need to be switched to a fully charged monitor battery approximately once a week. The cell phone will alert you of an action which needs to be made.  On the cell phone, tap for details to reveal connection status, monitor battery status, and cell phone battery status. The green dots indicates your monitor is in good status. A red dot indicates there is something that needs your attention.  To record a symptom, click the circle on the monitor battery. In 30-60 seconds a list of symptoms will appear on the cell phone. Select your symptom and tap save. Your monitor will record a sustained or significant arrhythmia regardless of you clicking the button. Some patients do not feel the heart rhythm irregularities. [employer] will notify us  of any serious or critical events.  Refer to instruction booklet for instructions on switching batteries, changing strips, the Do not disturb or Pause features, or any additional questions.  Call [employer] at [phone], to confirm your monitor is transmitting and record your baseline. They will answer any questions you may have regarding the monitor instructions at that time.  Returning the monitor to [place] all equipment back into blue box. Peel off strip of paper to expose adhesive and close box securely. There is a prepaid UPS shipping label on this box. Drop in a UPS drop box, or at a UPS facility like [employer]. You may also contact [employer] to arrange UPS to pick up monitor package at your home.

## 2020-06-13 ENCOUNTER — Telehealth: Payer: Self-pay | Admitting: Radiology

## 2020-06-13 NOTE — Telephone Encounter (Signed)
Enrolled patient for a 3 day Zio XT monitor to be mailed to patients home. Per ordering doctor patient only needs to wear for 24 hours or up to 3 days.

## 2020-06-19 ENCOUNTER — Other Ambulatory Visit (INDEPENDENT_AMBULATORY_CARE_PROVIDER_SITE_OTHER): Payer: PRIVATE HEALTH INSURANCE

## 2020-06-19 ENCOUNTER — Telehealth (HOSPITAL_COMMUNITY): Payer: Self-pay

## 2020-06-19 DIAGNOSIS — R002 Palpitations: Secondary | ICD-10-CM

## 2020-06-19 NOTE — Telephone Encounter (Signed)
Encounter complete. 

## 2020-06-20 ENCOUNTER — Other Ambulatory Visit (HOSPITAL_COMMUNITY): Payer: PRIVATE HEALTH INSURANCE

## 2020-06-24 ENCOUNTER — Ambulatory Visit (HOSPITAL_COMMUNITY)
Admission: RE | Admit: 2020-06-24 | Discharge: 2020-06-24 | Disposition: A | Discharge status: Home / Self Care | Payer: PRIVATE HEALTH INSURANCE | Source: Ambulatory Visit | Attending: Cardiovascular Disease | Admitting: Cardiovascular Disease

## 2020-06-24 ENCOUNTER — Telehealth: Payer: Self-pay | Admitting: Cardiovascular Disease

## 2020-06-24 ENCOUNTER — Other Ambulatory Visit: Payer: Self-pay

## 2020-06-24 DIAGNOSIS — Z8249 Family history of ischemic heart disease and other diseases of the circulatory system: Secondary | ICD-10-CM | POA: Insufficient documentation

## 2020-06-24 DIAGNOSIS — R002 Palpitations: Secondary | ICD-10-CM | POA: Insufficient documentation

## 2020-06-24 LAB — EXERCISE TOLERANCE TEST
Estimated workload: 11.2 METS
Exercise duration (min): 9 min
Exercise duration (sec): 42 s
MPHR: 173 {beats}/min
Peak HR: 173 {beats}/min
Percent HR: 100 %
Rest HR: 85 {beats}/min

## 2020-06-24 NOTE — Telephone Encounter (Signed)
Follow Up:     Pt is returning your call. 

## 2020-06-24 NOTE — Telephone Encounter (Signed)
Call placed to the patient to make sure she had the covid test completed. The patient will bring a copy of the negative covid results with her to the [facility] today.

## 2020-06-25 ENCOUNTER — Ambulatory Visit (HOSPITAL_COMMUNITY): Payer: PRIVATE HEALTH INSURANCE | Attending: Cardiology

## 2020-06-25 DIAGNOSIS — R002 Palpitations: Secondary | ICD-10-CM | POA: Insufficient documentation

## 2020-06-25 DIAGNOSIS — Z8249 Family history of ischemic heart disease and other diseases of the circulatory system: Secondary | ICD-10-CM | POA: Diagnosis not present

## 2020-06-25 LAB — ECHOCARDIOGRAM COMPLETE
Area-P 1/2: 4.6 cm2
S' Lateral: 2.9 cm

## 2020-09-29 ENCOUNTER — Other Ambulatory Visit: Payer: Self-pay | Admitting: Family Medicine

## 2020-09-29 DIAGNOSIS — Z1231 Encounter for screening mammogram for malignant neoplasm of breast: Secondary | ICD-10-CM

## 2020-10-02 ENCOUNTER — Other Ambulatory Visit: Payer: Self-pay

## 2020-10-02 ENCOUNTER — Ambulatory Visit
Admission: RE | Admit: 2020-10-02 | Discharge: 2020-10-02 | Disposition: A | Discharge status: Home / Self Care | Payer: PRIVATE HEALTH INSURANCE | Source: Ambulatory Visit

## 2020-10-02 DIAGNOSIS — Z1231 Encounter for screening mammogram for malignant neoplasm of breast: Secondary | ICD-10-CM

## 2020-10-03 ENCOUNTER — Encounter: Payer: Self-pay | Admitting: Oncology

## 2020-10-06 ENCOUNTER — Other Ambulatory Visit: Payer: Self-pay | Admitting: Family Medicine

## 2020-10-06 DIAGNOSIS — R928 Other abnormal and inconclusive findings on diagnostic imaging of breast: Secondary | ICD-10-CM

## 2020-10-07 ENCOUNTER — Ambulatory Visit: Payer: PRIVATE HEALTH INSURANCE | Admitting: Oncology

## 2020-10-10 ENCOUNTER — Other Ambulatory Visit (HOSPITAL_COMMUNITY): Payer: Self-pay | Admitting: Family Medicine

## 2020-10-11 ENCOUNTER — Ambulatory Visit
Admission: RE | Admit: 2020-10-11 | Discharge: 2020-10-11 | Disposition: A | Discharge status: Home / Self Care | Payer: PRIVATE HEALTH INSURANCE | Source: Ambulatory Visit | Attending: Family Medicine | Admitting: Family Medicine

## 2020-10-11 ENCOUNTER — Other Ambulatory Visit: Payer: Self-pay

## 2020-10-11 DIAGNOSIS — R928 Other abnormal and inconclusive findings on diagnostic imaging of breast: Secondary | ICD-10-CM

## 2020-10-19 NOTE — Progress Notes (Signed)
[name]  [phone] [phone]     ID: Alexandra Carlson DOB: [birth year]  MR#: [id number]  IHW#:[id number]  Patient Care Team: Gaynelle Arabian, MD as PCP - General (Family Medicine) Croitoru, Dani Gobble, MD as PCP - Cardiology (Cardiology) Fanny Skates, MD as Consulting Physician (General Surgery) Deandra Gadson, Alexandra Dad, MD as Consulting Physician (Oncology) Gavin Pound, MD as Consulting Physician (Rheumatology) Druscilla Brownie, MD as Consulting Physician (Dermatology) Thurman Coyer, DO as Consulting Physician (Sports Medicine) Sloan Leiter, MD as Consulting Physician (Obstetrics and Gynecology) OTHER MD:  CHIEF COMPLAINT: atypical ductal hyperplasia  CURRENT TREATMENT: Completing 5 years tamoxifen   INTERVAL HISTORY: Alexandra Carlson returns today for 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    She continues on tamoxifen, for breast cancer prevention.  She tolerates this well.  She has not had problems with hot flashes or vaginal discharge.  Recall she has a Mirena in place so she is not having any periods.  Since her last visit, she had routine screening mammogram on 10/02/2020 showing an abnormality in the right breast. She underwent right diagnostic mammography with right breast ultrasonography at [facility] on 10/11/2020 showing: breast density category C; 0.6 cm cyst at 8:30; no findings of malignancy.   REVIEW OF SYSTEMS: Alexandra Carlson tells me her psoriatic arthritis is generally well controlled.  She has had no intercurrent fever bleeding, or drenching sweats or fevers.  A detailed review of systems today was otherwise stable   COVID 19 VACCINATION STATUS: status post vaccine x2 and booster October 2021   BREAST CANCER HISTORY: From the original intake note:  Alexandra Carlson had routine screening bilateral mammography with tomography 06/02/2015 at the [facility] Center, showing some calcifications in the right breast. On  08/06/2015 she underwent diagnostic right mammography. This found the breast density to be category C. In the lower outer quadrant of the right breast there was a group of punctate and pleomorphic calcifications measuring 1.5 cm. Stereotactic biopsy of this area was recommended and performed 06/10/2015. This showed ([id number]) a ductal papilloma with atypical ductal hyperplasia.  Accordingly the patient was referred to surgery and on 07/15/2015 underwent right lumpectomy. 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 There was no evidence of invasive disease.  The patient's subsequent history is as detailed below   PAST MEDICAL HISTORY: Past Medical History:  Diagnosis Date  . Arthritis    psoriatic arthritis  . Atypical ductal hyperplasia of breast    right  . Migraines, neuralgic     PAST SURGICAL HISTORY: Past Surgical History:  Procedure Laterality Date  . BREAST BIOPSY Right 06/19/2012  . BREAST EXCISIONAL BIOPSY Right 07/2012  . BREAST LUMPECTOMY WITH RADIOACTIVE SEED LOCALIZATION Right 07/15/2015   Procedure: BREAST LUMPECTOMY WITH RADIOACTIVE SEED LOCALIZATION;  Surgeon: Fanny Skates, MD;  Location: [facility];  Service: General;  Laterality: Right;  . KNEE ARTHROSCOPY Right   . RHINOPLASTY      FAMILY HISTORY Family History  Problem Relation Age of Onset  . Breast cancer Neg Hx    The patient's parents are currently in their early 25s. The patient has one brother, no sisters. There is no history of breast or ovarian cancer in the family. (Note: The patient has little information regarding the paternal side).   GYNECOLOGIC HISTORY:  No LMP recorded. (Menstrual status: IUD). Menarche age 26, first live birth age 9. The patient is GX P1. She is having regular periods, the most  recent 08/27/2015. Her periods occur every 28 days, and last about 7 days, of which only the first is heavy.  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 Her husband Alexandra Carlson") works in apartment maintenance. Their daughter Alexandra Carlson, 58 years old, is in high school. The patient attends a local [employer] DIRECTIVES: In the absence of any documentation to the contrary, the patient's spouse is their HCPOA.   HEALTH MAINTENANCE: Social History   Tobacco Use  . Smoking status: Never Smoker  . Smokeless tobacco: Never Used  Substance Use Topics  . Alcohol use: No  . Drug use: No     Colonoscopy:  PAP: 04/18/2018-- normal  Bone density:  Lipid panel:  Allergies  Allergen Reactions  . Codeine Nausea Only    Current Outpatient Medications  Medication Sig Dispense Refill  . celecoxib (CELEBREX) 200 MG capsule Take 400 mg by mouth daily.    Marland Kitchen etanercept (ENBREL) 25 MG injection Inject 25 mg into the skin once a week.    Marland Kitchen levonorgestrel (MIRENA) 20 MCG/24HR IUD 1 each by Intrauterine route once.    . meloxicam (MOBIC) 15 MG tablet Take 15 mg by mouth daily.    . tamoxifen (NOLVADEX) 20 MG tablet Take 1 tablet (20 mg total) by mouth daily. 90 tablet 4  . traZODone (DESYREL) 50 MG tablet Take 50-150 mg by mouth at bedtime as needed.    . valACYclovir (VALTREX) 1000 MG tablet   5   No current facility-administered medications for this visit.    OBJECTIVE: White woman in no acute distress  Vitals:   10/20/20 1052  BP: 139/78  Pulse: 71  Resp: 18  Temp: 97.6 F (36.4 C)  SpO2: 100%     Body mass index is 28.89 kg/m.    ECOG FS:0 - Asymptomatic  Sclerae unicteric, EOMs intact Wearing a mask No cervical or supraclavicular adenopathy Lungs no rales or rhonchi Heart regular rate and rhythm Abd soft, nontender, positive bowel sounds MSK no focal spinal tenderness, no upper extremity lymphedema Neuro: nonfocal, well oriented, appropriate affect Breasts: The right  breast is status post lumpectomy.  There is no mass or skin or nipple change of concern.  The left breast is benign.  Both axillae are benign.   LAB RESULTS:  CMP     Component Value Date/Time   NA 141 09/02/2015 1607   K 4.4 09/02/2015 1607   CO2 27 09/02/2015 1607   GLUCOSE 89 09/02/2015 1607   BUN 16.1 09/02/2015 1607   CREATININE 0.8 09/02/2015 1607   CALCIUM 9.1 09/02/2015 1607   PROT 6.9 09/02/2015 1607   ALBUMIN 3.7 09/02/2015 1607   AST 15 09/02/2015 1607   ALT 14 09/02/2015 1607   ALKPHOS 66 09/02/2015 1607   BILITOT 0.49 09/02/2015 1607    INo results found for: SPEP, UPEP  Lab Results  Component Value Date   WBC 7.1 09/02/2015   NEUTROABS 3.2 09/02/2015   HGB 13.3 09/02/2015   HCT 40.5 09/02/2015   MCV 94.5 09/02/2015   PLT 300 09/02/2015      Chemistry      Component Value Date/Time   NA 141 09/02/2015 1607   K 4.4 09/02/2015 1607   CO2 27 09/02/2015 1607   BUN 16.1 09/02/2015 1607   CREATININE 0.8 09/02/2015 1607      Component Value  Date/Time   CALCIUM 9.1 09/02/2015 1607   ALKPHOS 66 09/02/2015 1607   AST 15 09/02/2015 1607   ALT 14 09/02/2015 1607   BILITOT 0.49 09/02/2015 1607       No results found for: LABCA2  No components found for: LABCA125  No results for input(s): INR in the last 168 hours.  Urinalysis No results found for: COLORURINE, APPEARANCEUR, LABSPEC, [place], GLUCOSEU, HGBUR, BILIRUBINUR, KETONESUR, PROTEINUR, UROBILINOGEN, NITRITE, LEUKOCYTESUR   STUDIES: US BREAST LTD UNI RIGHT INC AXILLA  Result Date: 10/11/2020 CLINICAL DATA:  Screening recall for right breast mass. EXAM: DIGITAL DIAGNOSTIC UNILATERAL RIGHT MAMMOGRAM WITH TOMO AND CAD; ULTRASOUND RIGHT BREAST LIMITED COMPARISON:  Previous exams. ACR Breast Density Category c: The breast tissue is heterogeneously dense, which may obscure small masses. FINDINGS: Additional tomograms were performed of the right breast. There is an oval low density mass in the  slightly upper outer posterior right breast measuring 0.6 cm. Mammographic images were processed with CAD. Targeted ultrasound of the outer right breast was performed. There is a cyst at the 8:30 position 8 cm from nipple measuring 0.6 x 0.4 x 0.6 cm. This corresponds well with the mass seen in the right breast at mammography. IMPRESSION: No findings of malignancy in the right breast. RECOMMENDATION: Screening mammogram in one year.(Code:SM-B-01Y) I have discussed the findings and recommendations with the patient. If applicable, a reminder letter will be sent to the patient regarding the next appointment. BI-RADS CATEGORY  2: Benign. Electronically Signed   By: Everlean Alstrom M.D.   On: 10/11/2020 08:02   MM DIAG BREAST TOMO UNI RIGHT  Result Date: 10/11/2020 CLINICAL DATA:  Screening recall for right breast mass. EXAM: DIGITAL DIAGNOSTIC UNILATERAL RIGHT MAMMOGRAM WITH TOMO AND CAD; ULTRASOUND RIGHT BREAST LIMITED COMPARISON:  Previous exams. ACR Breast Density Category c: The breast tissue is heterogeneously dense, which may obscure small masses. FINDINGS: Additional tomograms were performed of the right breast. There is an oval low density mass in the slightly upper outer posterior right breast measuring 0.6 cm. Mammographic images were processed with CAD. Targeted ultrasound of the outer right breast was performed. There is a cyst at the 8:30 position 8 cm from nipple measuring 0.6 x 0.4 x 0.6 cm. This corresponds well with the mass seen in the right breast at mammography. IMPRESSION: No findings of malignancy in the right breast. RECOMMENDATION: Screening mammogram in one year.(Code:SM-B-01Y) I have discussed the findings and recommendations with the patient. If applicable, a reminder letter will be sent to the patient regarding the next appointment. BI-RADS CATEGORY  2: Benign. Electronically Signed   By: Everlean Alstrom M.D.   On: 10/11/2020 08:02   MM 3D SCREEN BREAST BILATERAL  Result Date:  10/03/2020 CLINICAL DATA:  Screening. EXAM: DIGITAL SCREENING BILATERAL MAMMOGRAM WITH TOMO AND CAD COMPARISON:  Previous exam(s). ACR Breast Density Category c: The breast tissue is heterogeneously dense, which may obscure small masses. FINDINGS: In the right breast, a possible mass warrants further evaluation. In the left breast, no findings suspicious for malignancy. Images were processed with CAD. IMPRESSION: Further evaluation is suggested for a possible mass in the right breast. RECOMMENDATION: Diagnostic mammogram and possibly ultrasound of the right breast. (Code:[id number]) The patient will be contacted regarding the findings, and additional imaging will be scheduled. BI-RADS CATEGORY  0: Incomplete. Need additional imaging evaluation and/or prior mammograms for comparison. Electronically Signed   By: Lovey Newcomer M.D.   On: 10/03/2020 08:32     ASSESSMENT: 49 y.o. [place], [place] woman status post right breast lower outer quadrant lumpectomy 07/15/2015 for an intraductal papilloma with atypical ductal hyperplasia  (a) started tamoxifen for risk reduction November 2016, completing 5 years December 2021  (b) Mirena IUD placed November 2016   PLAN Alexandra Carlson has completed 5 years of tamoxifen.  For risk reduction that is the optimal duration and we do not have data that her taking tamoxifen longer would reduce the risk further.  Accordingly we are stopping tamoxifen at this point.  Generally I feel very comfortable with Alexandra Carlson while the patient is on tamoxifen but since she is now going off my recommendation is that she change to a copper or otherwise nonhormonal type of contraception  At this point I feel comfortable releasing Alexandra Carlson to her primary care physicians and gynecologist care.  All she will need in terms of breast cancer follow-up is her yearly mammography and a yearly physician breast exam.  I will be glad to see her again at any point in the future if and when the need  arises but as of now are making no further routine appointments for her here.  Total encounter time 25 minutes.*   Alexandra Carlson, Alexandra Dad, MD  10/20/20 11:04 AM Medical Oncology and Hematology [facility] [place],  [address] Tel. [phone]    Fax. [phone]   I, Alexandra Carlson am acting as scribe for Dr. Virgie Carlson. Alexandra Carlson.  I, Lurline Del MD, have reviewed the above documentation for accuracy and completeness, and I agree with the above.   *Total Encounter Time as defined by the Centers for Medicare and Medicaid Services includes, in addition to the face-to-face time of a patient visit (documented in the note above) non-face-to-face time: obtaining and reviewing outside history, ordering and reviewing medications, tests or procedures, care coordination (communications with other health care professionals or caregivers) and documentation in the medical record.

## 2020-10-20 ENCOUNTER — Other Ambulatory Visit: Payer: Self-pay

## 2020-10-20 ENCOUNTER — Inpatient Hospital Stay: Payer: PRIVATE HEALTH INSURANCE | Attending: Oncology | Admitting: Oncology

## 2020-10-20 VITALS — BP 139/78 | HR 71 | Temp 97.6°F | Resp 18 | Ht 66.0 in | Wt 179.0 lb

## 2020-10-20 DIAGNOSIS — Z1239 Encounter for other screening for malignant neoplasm of breast: Secondary | ICD-10-CM | POA: Diagnosis not present

## 2020-10-20 DIAGNOSIS — Z79810 Long term (current) use of selective estrogen receptor modulators (SERMs): Secondary | ICD-10-CM | POA: Insufficient documentation

## 2020-10-20 DIAGNOSIS — L4050 Arthropathic psoriasis, unspecified: Secondary | ICD-10-CM | POA: Insufficient documentation

## 2020-10-20 DIAGNOSIS — M129 Arthropathy, unspecified: Secondary | ICD-10-CM | POA: Insufficient documentation

## 2020-10-20 DIAGNOSIS — N6091 Unspecified benign mammary dysplasia of right breast: Secondary | ICD-10-CM | POA: Diagnosis not present

## 2020-10-20 DIAGNOSIS — R232 Flushing: Secondary | ICD-10-CM | POA: Diagnosis not present

## 2020-10-20 DIAGNOSIS — Z793 Long term (current) use of hormonal contraceptives: Secondary | ICD-10-CM | POA: Insufficient documentation

## 2020-10-20 DIAGNOSIS — Z791 Long term (current) use of non-steroidal anti-inflammatories (NSAID): Secondary | ICD-10-CM | POA: Insufficient documentation

## 2020-10-21 ENCOUNTER — Telehealth: Payer: Self-pay | Admitting: Oncology

## 2020-10-21 NOTE — Telephone Encounter (Signed)
No 12/20 los. No changes made to pt's schedule.

## 2020-12-10 ENCOUNTER — Other Ambulatory Visit (HOSPITAL_COMMUNITY): Payer: Self-pay | Admitting: Family Medicine

## 2021-01-19 ENCOUNTER — Other Ambulatory Visit (HOSPITAL_COMMUNITY): Payer: Self-pay | Admitting: Family Medicine

## 2021-03-02 ENCOUNTER — Other Ambulatory Visit (HOSPITAL_COMMUNITY): Payer: Self-pay

## 2021-04-11 ENCOUNTER — Other Ambulatory Visit (HOSPITAL_COMMUNITY): Payer: Self-pay

## 2021-04-11 MED FILL — Trazodone HCl Tab 50 MG: ORAL | 30 days supply | Qty: 90 | Fill #0 | Status: AC

## 2021-04-11 MED FILL — Valacyclovir HCl Tab 1 GM: ORAL | 90 days supply | Qty: 90 | Fill #0 | Status: AC

## 2021-04-13 ENCOUNTER — Other Ambulatory Visit (HOSPITAL_COMMUNITY): Payer: Self-pay

## 2021-04-13 MED ORDER — MELOXICAM 15 MG PO TABS
ORAL_TABLET | ORAL | 1 refills | Status: DC
  Filled 2021-04-13: qty 90, 90d supply, fill #0

## 2021-04-29 ENCOUNTER — Other Ambulatory Visit: Payer: Self-pay | Admitting: Nurse Practitioner

## 2021-05-06 ENCOUNTER — Other Ambulatory Visit: Payer: Self-pay | Admitting: Nurse Practitioner

## 2021-05-06 ENCOUNTER — Other Ambulatory Visit (HOSPITAL_COMMUNITY): Payer: Self-pay

## 2021-05-06 DIAGNOSIS — R2231 Localized swelling, mass and lump, right upper limb: Secondary | ICD-10-CM

## 2021-05-21 ENCOUNTER — Ambulatory Visit
Admission: RE | Admit: 2021-05-21 | Discharge: 2021-05-21 | Disposition: A | Discharge status: Home / Self Care | Payer: No Typology Code available for payment source | Source: Ambulatory Visit | Attending: Nurse Practitioner | Admitting: Nurse Practitioner

## 2021-05-21 ENCOUNTER — Ambulatory Visit
Admission: RE | Admit: 2021-05-21 | Discharge: 2021-05-21 | Disposition: A | Discharge status: Home / Self Care | Payer: PRIVATE HEALTH INSURANCE | Source: Ambulatory Visit | Attending: Nurse Practitioner | Admitting: Nurse Practitioner

## 2021-05-21 ENCOUNTER — Other Ambulatory Visit: Payer: Self-pay

## 2021-05-21 DIAGNOSIS — R2231 Localized swelling, mass and lump, right upper limb: Secondary | ICD-10-CM

## 2021-07-13 ENCOUNTER — Other Ambulatory Visit: Payer: Self-pay | Admitting: Family Medicine

## 2021-07-13 ENCOUNTER — Ambulatory Visit
Admission: RE | Admit: 2021-07-13 | Discharge: 2021-07-13 | Disposition: A | Discharge status: Home / Self Care | Payer: No Typology Code available for payment source | Source: Ambulatory Visit | Attending: Family Medicine | Admitting: Family Medicine

## 2021-07-13 ENCOUNTER — Other Ambulatory Visit: Payer: Self-pay

## 2021-07-13 DIAGNOSIS — N6452 Nipple discharge: Secondary | ICD-10-CM

## 2021-07-13 IMAGING — MG MM DIGITAL DIAGNOSTIC UNILAT*L* W/ TOMO W/ CAD
6 series · 6 of 18 positions shown · non-contrast
Comparison: Prior films

CLINICAL DATA: Patient complains of bloody left nipple discharge

EXAM:
DIGITAL DIAGNOSTIC UNILATERAL LEFT MAMMOGRAM WITH TOMOSYNTHESIS AND
CAD; ULTRASOUND LEFT BREAST LIMITED
TECHNIQUE: Left digital diagnostic mammography and breast tomosynthesis was
performed. The images were evaluated with computer-aided detection.;
Targeted ultrasound examination of the left breast was performed.

[L MLO synth-2D (1 of 2)]
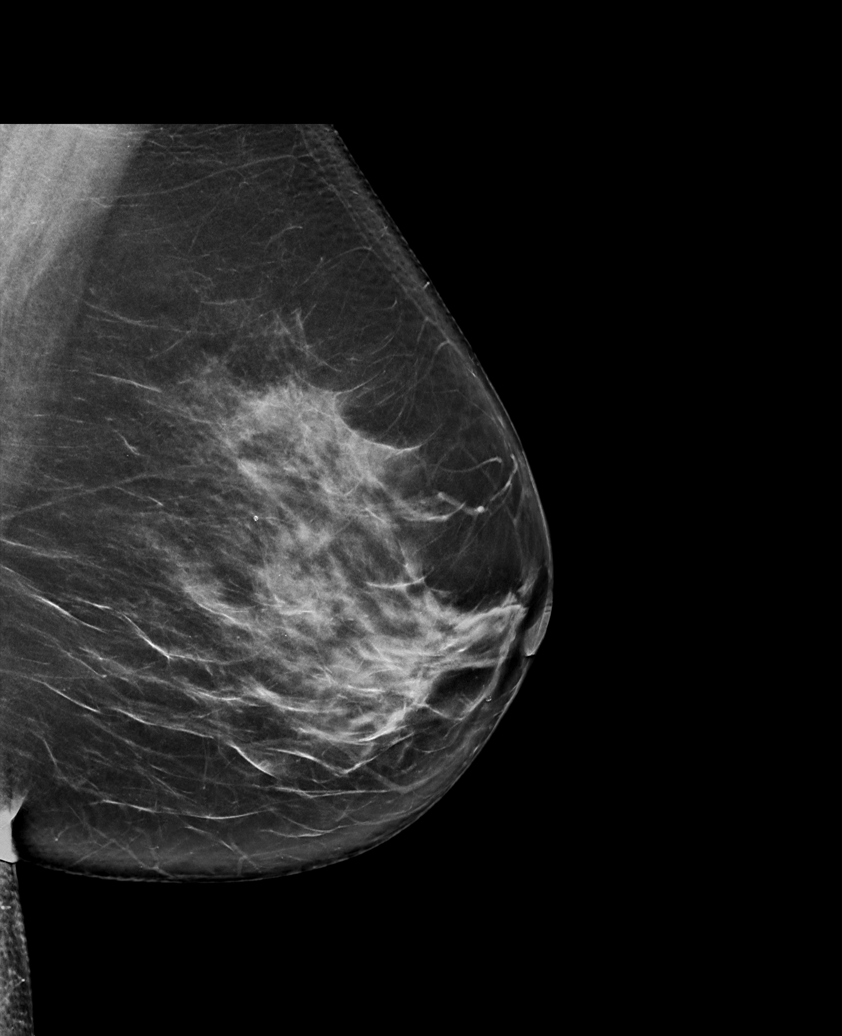

[L CC synth-2D]
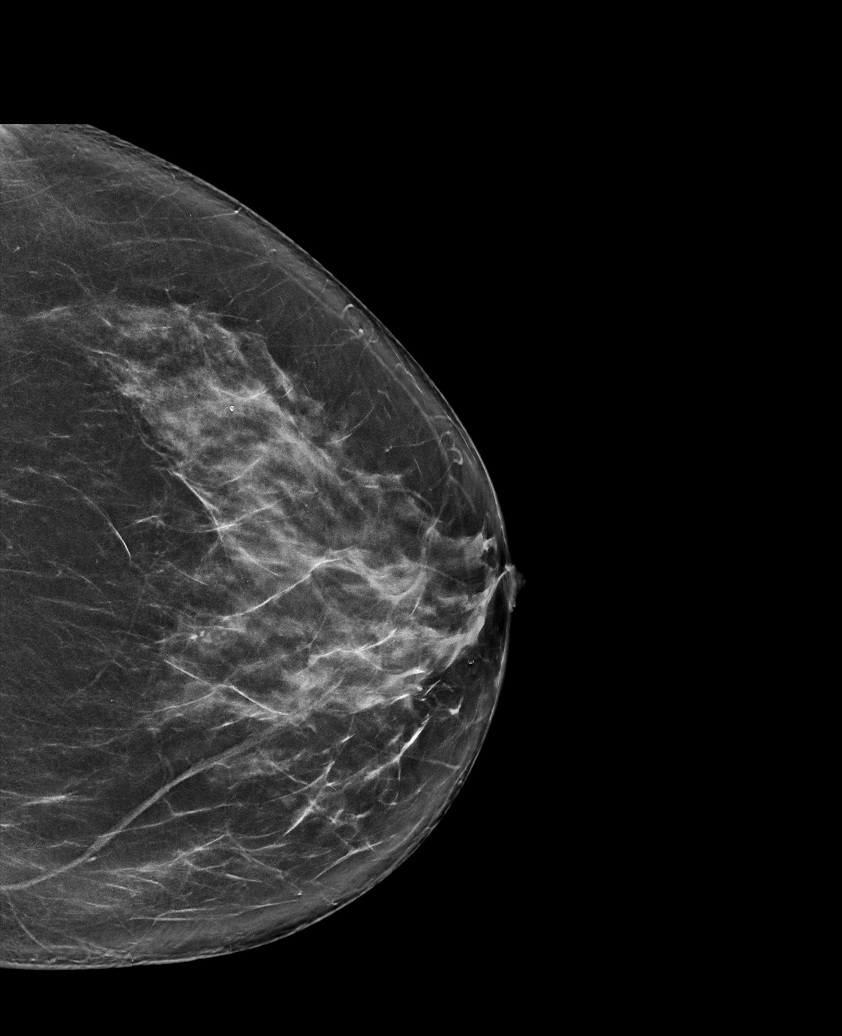

[L MLO synth-2D (2 of 2)]
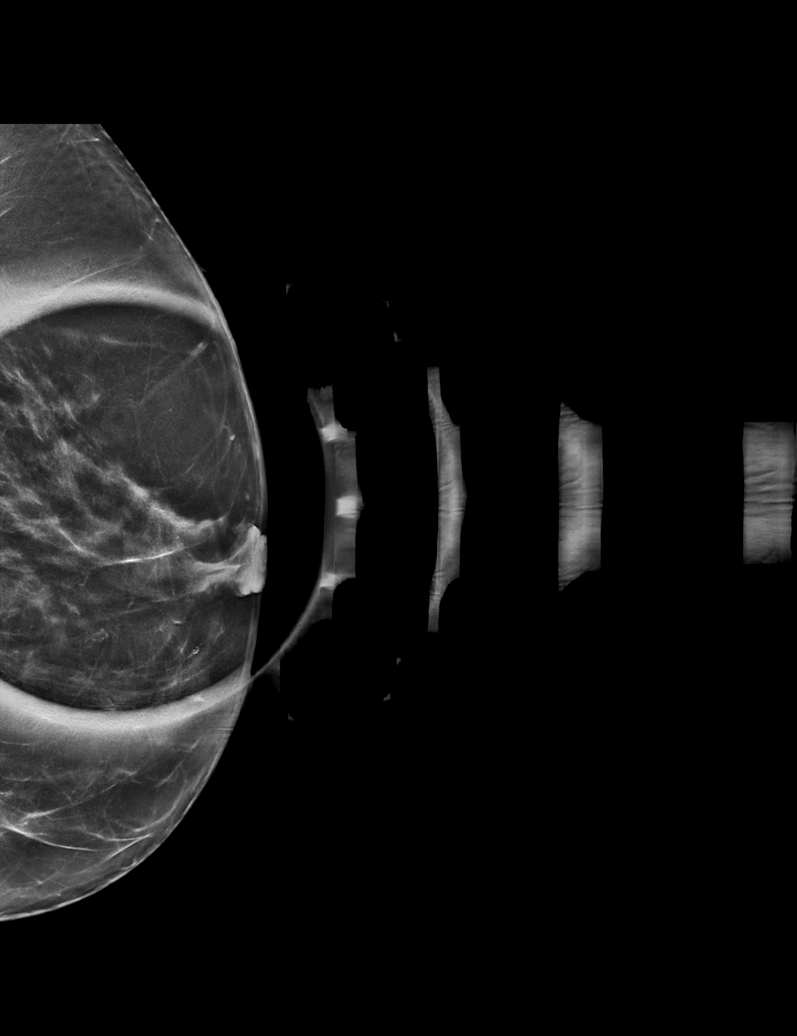

[L CC tomo · tomo slice 39/78.0]
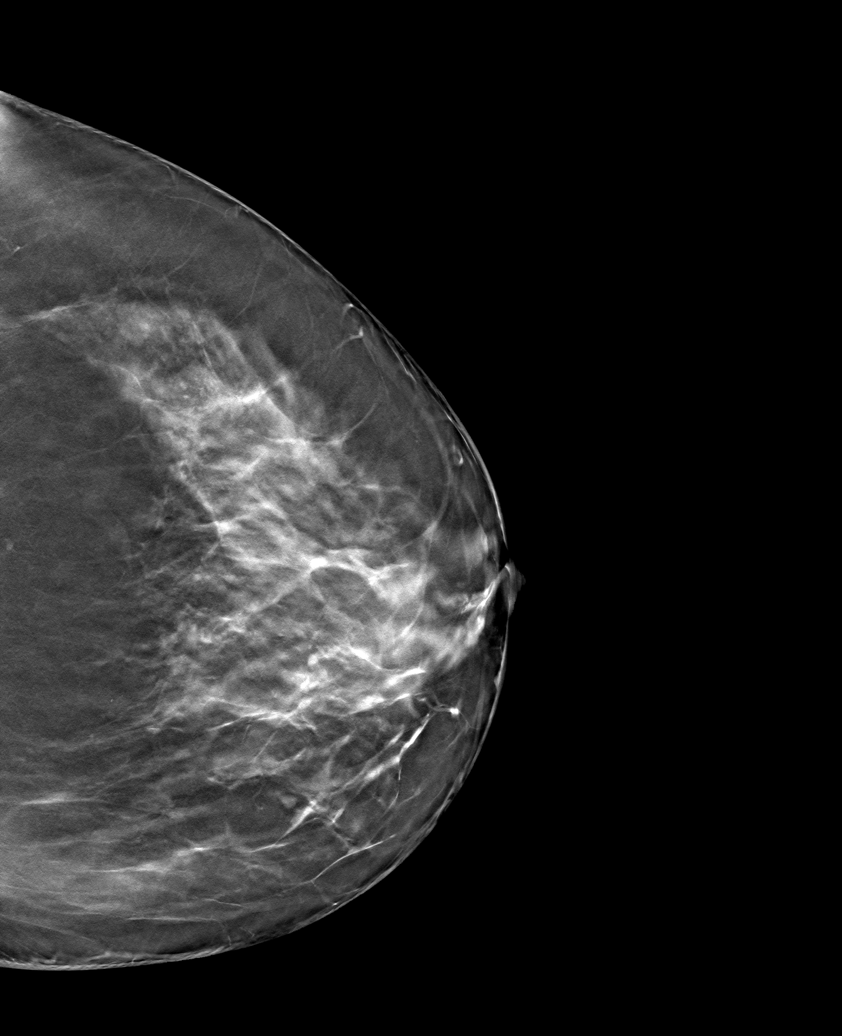

[L MLO tomo (1 of 2) · tomo slice 31/61.0]
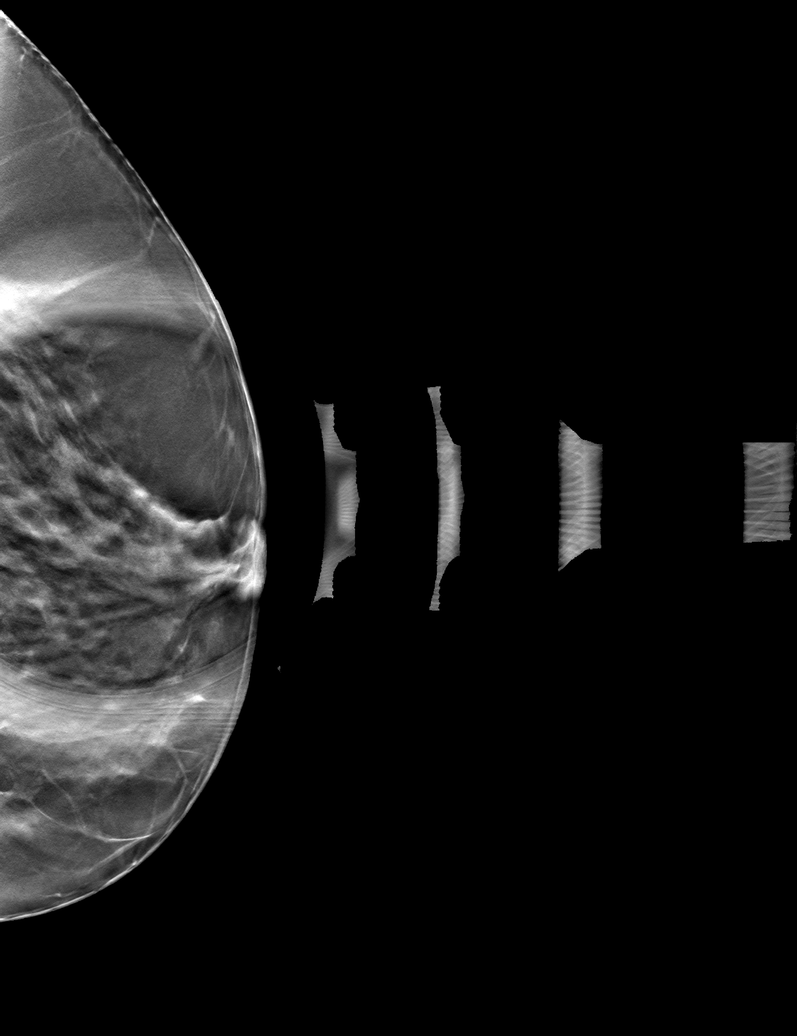

[L MLO tomo (2 of 2) · tomo slice 43/84.0]
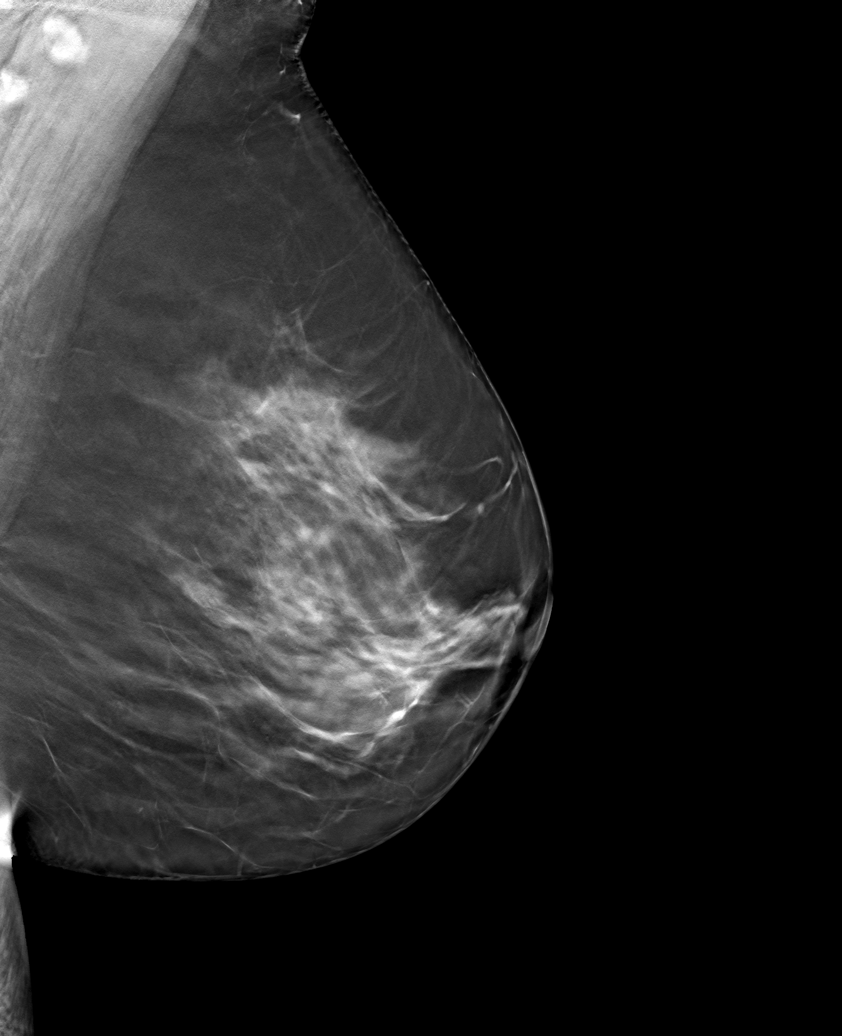

[6 of 18 positions shown; findings below may reference images not displayed]

ACR Breast Density Category c: The breast tissue is heterogeneously
dense, which may obscure small masses.
FINDINGS: Cc and MLO views of the left breast, spot tangential view of left
breast are submitted. No suspicious abnormality is identified.

On physical exam, I expressed single duct left nipple bloody
discharge.

Targeted ultrasound is performed, showing oval hypoechoic lesion at
the left breast 7 o'clock subareolar region measuring 4 x 4 x 6 mm.
Ultrasound of the left axilla is negative.
IMPRESSION: Suspicious findings.

RECOMMENDATION:
Ultrasound-guided core biopsy of left 7 o'clock subareolar mass.

I have discussed the findings and recommendations with the patient.
If applicable, a reminder letter will be sent to the patient
regarding the next appointment.

BI-RADS CATEGORY  4: Suspicious.

## 2021-07-17 ENCOUNTER — Ambulatory Visit
Admission: RE | Admit: 2021-07-17 | Discharge: 2021-07-17 | Disposition: A | Discharge status: Home / Self Care | Payer: No Typology Code available for payment source | Source: Ambulatory Visit | Attending: Family Medicine | Admitting: Family Medicine

## 2021-07-17 ENCOUNTER — Other Ambulatory Visit: Payer: Self-pay | Admitting: Family Medicine

## 2021-07-17 ENCOUNTER — Other Ambulatory Visit: Payer: Self-pay

## 2021-07-17 DIAGNOSIS — N6452 Nipple discharge: Secondary | ICD-10-CM

## 2021-07-22 ENCOUNTER — Other Ambulatory Visit: Payer: Self-pay | Admitting: Family Medicine

## 2021-07-22 DIAGNOSIS — N6012 Diffuse cystic mastopathy of left breast: Secondary | ICD-10-CM

## 2021-07-22 DIAGNOSIS — N6099 Unspecified benign mammary dysplasia of unspecified breast: Secondary | ICD-10-CM

## 2021-07-31 ENCOUNTER — Other Ambulatory Visit (HOSPITAL_COMMUNITY): Payer: Self-pay

## 2021-07-31 MED FILL — Trazodone HCl Tab 50 MG: ORAL | 30 days supply | Qty: 90 | Fill #0 | Status: AC

## 2021-07-31 MED FILL — Trazodone HCl Tab 50 MG: ORAL | 30 days supply | Qty: 90 | Fill #1 | Status: CN

## 2021-08-16 ENCOUNTER — Ambulatory Visit
Admission: RE | Admit: 2021-08-16 | Discharge: 2021-08-16 | Disposition: A | Discharge status: Home / Self Care | Payer: No Typology Code available for payment source | Source: Ambulatory Visit | Attending: Family Medicine | Admitting: Family Medicine

## 2021-08-16 ENCOUNTER — Other Ambulatory Visit: Payer: Self-pay

## 2021-08-16 DIAGNOSIS — N6099 Unspecified benign mammary dysplasia of unspecified breast: Secondary | ICD-10-CM

## 2021-08-16 DIAGNOSIS — N6012 Diffuse cystic mastopathy of left breast: Secondary | ICD-10-CM

## 2021-08-16 MED ORDER — GADOBUTROL 1 MMOL/ML IV SOLN
8.0000 mL | Freq: Once | INTRAVENOUS | Status: AC | PRN
  Administered 2021-08-16: 8 mL via INTRAVENOUS

## 2021-08-19 ENCOUNTER — Other Ambulatory Visit: Payer: Self-pay | Admitting: Family Medicine

## 2021-08-19 DIAGNOSIS — R9389 Abnormal findings on diagnostic imaging of other specified body structures: Secondary | ICD-10-CM

## 2021-08-27 ENCOUNTER — Other Ambulatory Visit (HOSPITAL_COMMUNITY): Payer: Self-pay | Admitting: Diagnostic Radiology

## 2021-08-27 ENCOUNTER — Ambulatory Visit
Admission: RE | Admit: 2021-08-27 | Discharge: 2021-08-27 | Disposition: A | Discharge status: Home / Self Care | Payer: No Typology Code available for payment source | Source: Ambulatory Visit | Attending: Family Medicine | Admitting: Family Medicine

## 2021-08-27 ENCOUNTER — Other Ambulatory Visit: Payer: Self-pay

## 2021-08-27 DIAGNOSIS — R9389 Abnormal findings on diagnostic imaging of other specified body structures: Secondary | ICD-10-CM

## 2021-08-27 MED ORDER — GADOBUTROL 1 MMOL/ML IV SOLN: 10.0000 mL | Freq: Once | INTRAVENOUS | Status: DC | PRN

## 2021-09-14 ENCOUNTER — Encounter: Payer: Self-pay | Admitting: Oncology

## 2021-10-06 ENCOUNTER — Other Ambulatory Visit: Payer: Self-pay | Admitting: Family Medicine

## 2021-10-06 DIAGNOSIS — Z9889 Other specified postprocedural states: Secondary | ICD-10-CM

## 2021-10-29 ENCOUNTER — Other Ambulatory Visit (HOSPITAL_COMMUNITY): Payer: Self-pay

## 2021-10-30 ENCOUNTER — Other Ambulatory Visit (HOSPITAL_COMMUNITY): Payer: Self-pay

## 2021-11-02 ENCOUNTER — Other Ambulatory Visit (HOSPITAL_COMMUNITY): Payer: Self-pay

## 2021-11-02 MED ORDER — TRAZODONE HCL 50 MG PO TABS
50.0000 mg | ORAL_TABLET | Freq: Every day | ORAL | 1 refills | Status: DC
  Filled 2021-11-02: qty 90, 30d supply, fill #0
  Filled 2022-01-11: qty 90, 30d supply, fill #1

## 2021-11-09 ENCOUNTER — Ambulatory Visit
Admission: RE | Admit: 2021-11-09 | Discharge: 2021-11-09 | Disposition: A | Discharge status: Home / Self Care | Payer: No Typology Code available for payment source | Source: Ambulatory Visit | Attending: Family Medicine | Admitting: Family Medicine

## 2021-11-09 DIAGNOSIS — Z9889 Other specified postprocedural states: Secondary | ICD-10-CM

## 2021-11-27 ENCOUNTER — Other Ambulatory Visit: Payer: Self-pay | Admitting: Surgery

## 2022-01-12 ENCOUNTER — Other Ambulatory Visit (HOSPITAL_COMMUNITY): Payer: Self-pay

## 2022-01-22 ENCOUNTER — Other Ambulatory Visit: Payer: Self-pay | Admitting: Family Medicine

## 2022-01-22 DIAGNOSIS — N6452 Nipple discharge: Secondary | ICD-10-CM

## 2022-01-25 ENCOUNTER — Other Ambulatory Visit (HOSPITAL_COMMUNITY): Payer: Self-pay

## 2022-01-25 MED ORDER — PEG 3350-KCL-NA BICARB-NACL 420 G PO SOLR
ORAL | 0 refills | Status: DC
  Filled 2022-01-25: qty 4000, 1d supply, fill #0

## 2022-01-26 ENCOUNTER — Other Ambulatory Visit (HOSPITAL_COMMUNITY): Payer: Self-pay

## 2022-03-17 ENCOUNTER — Ambulatory Visit
Admission: RE | Admit: 2022-03-17 | Discharge: 2022-03-17 | Disposition: A | Discharge status: Home / Self Care | Payer: No Typology Code available for payment source | Source: Ambulatory Visit | Attending: Family Medicine | Admitting: Family Medicine

## 2022-03-17 DIAGNOSIS — N6452 Nipple discharge: Secondary | ICD-10-CM

## 2022-03-17 MED ORDER — GADOBUTROL 1 MMOL/ML IV SOLN
9.0000 mL | Freq: Once | INTRAVENOUS | Status: AC | PRN
  Administered 2022-03-17: 9 mL via INTRAVENOUS

## 2022-04-03 ENCOUNTER — Other Ambulatory Visit (HOSPITAL_COMMUNITY): Payer: Self-pay

## 2022-04-05 ENCOUNTER — Other Ambulatory Visit (HOSPITAL_COMMUNITY): Payer: Self-pay

## 2022-04-05 MED ORDER — TRAZODONE HCL 50 MG PO TABS
50.0000 mg | ORAL_TABLET | Freq: Every day | ORAL | 1 refills | Status: DC
  Filled 2022-04-05: qty 90, 30d supply, fill #0
  Filled 2022-07-13: qty 90, 30d supply, fill #1

## 2022-05-17 ENCOUNTER — Other Ambulatory Visit (HOSPITAL_COMMUNITY): Payer: Self-pay

## 2022-05-17 MED ORDER — QSYMIA 3.75-23 MG PO CP24
1.0000 | ORAL_CAPSULE | Freq: Every day | ORAL | 0 refills | Status: DC
  Filled 2022-05-17 – 2022-05-24 (×2): qty 14, 14d supply, fill #0

## 2022-05-17 MED ORDER — QSYMIA 7.5-46 MG PO CP24: 1.0000 | ORAL_CAPSULE | Freq: Every day | ORAL | 0 refills | Status: DC

## 2022-05-18 ENCOUNTER — Other Ambulatory Visit (HOSPITAL_COMMUNITY): Payer: Self-pay

## 2022-05-24 ENCOUNTER — Other Ambulatory Visit (HOSPITAL_COMMUNITY): Payer: Self-pay

## 2022-05-27 ENCOUNTER — Other Ambulatory Visit (HOSPITAL_COMMUNITY): Payer: Self-pay

## 2022-05-27 MED ORDER — PHENTERMINE HCL 15 MG PO CAPS
15.0000 mg | ORAL_CAPSULE | Freq: Every day | ORAL | 0 refills | Status: DC
  Filled 2022-05-27: qty 30, 30d supply, fill #0

## 2022-05-27 MED ORDER — TOPIRAMATE 25 MG PO TABS
25.0000 mg | ORAL_TABLET | Freq: Every day | ORAL | 0 refills | Status: DC
  Filled 2022-05-27: qty 30, 30d supply, fill #0

## 2022-06-10 ENCOUNTER — Other Ambulatory Visit (HOSPITAL_COMMUNITY): Payer: Self-pay

## 2022-06-10 MED ORDER — TOPIRAMATE 25 MG PO TABS
25.0000 mg | ORAL_TABLET | Freq: Every day | ORAL | 0 refills | Status: DC
  Filled 2022-06-10 – 2022-07-01 (×2): qty 15, 15d supply, fill #0

## 2022-06-10 MED ORDER — PHENTERMINE HCL 15 MG PO CAPS
15.0000 mg | ORAL_CAPSULE | Freq: Every day | ORAL | 0 refills | Status: DC
  Filled 2022-06-10 – 2022-07-01 (×2): qty 15, 15d supply, fill #0

## 2022-07-01 ENCOUNTER — Other Ambulatory Visit (HOSPITAL_COMMUNITY): Payer: Self-pay

## 2022-07-14 ENCOUNTER — Other Ambulatory Visit (HOSPITAL_COMMUNITY): Payer: Self-pay

## 2022-07-14 MED ORDER — TOPIRAMATE 25 MG PO TABS
25.0000 mg | ORAL_TABLET | Freq: Every day | ORAL | 0 refills | Status: DC
  Filled 2022-07-14: qty 30, 30d supply, fill #0

## 2022-07-14 MED ORDER — PHENTERMINE HCL 15 MG PO CAPS
15.0000 mg | ORAL_CAPSULE | Freq: Every day | ORAL | 0 refills | Status: DC
  Filled 2022-07-14: qty 30, 30d supply, fill #0

## 2022-08-16 ENCOUNTER — Other Ambulatory Visit: Payer: Self-pay

## 2022-08-16 ENCOUNTER — Other Ambulatory Visit (HOSPITAL_COMMUNITY): Payer: Self-pay

## 2022-08-16 MED ORDER — SULFAMETHOXAZOLE-TRIMETHOPRIM 800-160 MG PO TABS
1.0000 | ORAL_TABLET | Freq: Two times a day (BID) | ORAL | 0 refills | Status: DC
  Filled 2022-08-16: qty 10, 5d supply, fill #0

## 2022-08-19 ENCOUNTER — Other Ambulatory Visit (HOSPITAL_COMMUNITY): Payer: Self-pay

## 2022-08-19 MED ORDER — TOPIRAMATE 25 MG PO TABS
25.0000 mg | ORAL_TABLET | Freq: Every day | ORAL | 1 refills | Status: DC
  Filled 2022-08-19: qty 30, 30d supply, fill #0
  Filled 2023-01-15: qty 30, 30d supply, fill #1

## 2022-08-20 ENCOUNTER — Other Ambulatory Visit (HOSPITAL_COMMUNITY): Payer: Self-pay

## 2022-08-20 MED ORDER — PHENTERMINE HCL 15 MG PO CAPS
15.0000 mg | ORAL_CAPSULE | Freq: Every day | ORAL | 0 refills | Status: DC
  Filled 2022-08-20: qty 30, 30d supply, fill #0

## 2022-09-15 ENCOUNTER — Other Ambulatory Visit (HOSPITAL_COMMUNITY): Payer: Self-pay

## 2022-09-15 MED ORDER — TOPIRAMATE 25 MG PO TABS
25.0000 mg | ORAL_TABLET | Freq: Every day | ORAL | 1 refills | Status: DC
  Filled 2022-09-15: qty 30, 30d supply, fill #0
  Filled 2022-10-21: qty 30, 30d supply, fill #1

## 2022-09-16 ENCOUNTER — Other Ambulatory Visit (HOSPITAL_COMMUNITY): Payer: Self-pay

## 2022-09-16 MED ORDER — PHENTERMINE HCL 15 MG PO CAPS
15.0000 mg | ORAL_CAPSULE | Freq: Every day | ORAL | 0 refills | Status: DC
  Filled 2022-09-20: qty 28, 28d supply, fill #0

## 2022-09-20 ENCOUNTER — Other Ambulatory Visit (HOSPITAL_COMMUNITY): Payer: Self-pay

## 2022-09-20 MED ORDER — TRAZODONE HCL 50 MG PO TABS
50.0000 mg | ORAL_TABLET | Freq: Every evening | ORAL | 1 refills | Status: DC | PRN
  Filled 2022-09-20: qty 90, 30d supply, fill #0
  Filled 2023-01-15: qty 90, 30d supply, fill #1

## 2022-10-13 ENCOUNTER — Other Ambulatory Visit (HOSPITAL_COMMUNITY): Payer: Self-pay

## 2022-10-13 MED ORDER — VALACYCLOVIR HCL 1 G PO TABS
1000.0000 mg | ORAL_TABLET | Freq: Every day | ORAL | 1 refills | Status: DC | PRN
  Filled 2022-10-13: qty 10, 10d supply, fill #0
  Filled 2023-06-21: qty 10, 10d supply, fill #1

## 2022-10-14 ENCOUNTER — Other Ambulatory Visit (HOSPITAL_COMMUNITY): Payer: Self-pay

## 2022-10-14 MED ORDER — PHENTERMINE HCL 15 MG PO CAPS
15.0000 mg | ORAL_CAPSULE | Freq: Every day | ORAL | 0 refills | Status: DC
  Filled 2022-10-14 – 2022-10-21 (×2): qty 28, 28d supply, fill #0

## 2022-10-21 ENCOUNTER — Other Ambulatory Visit (HOSPITAL_COMMUNITY): Payer: Self-pay

## 2022-10-21 MED ORDER — OSELTAMIVIR PHOSPHATE 75 MG PO CAPS
75.0000 mg | ORAL_CAPSULE | Freq: Every day | ORAL | 0 refills | Status: DC
  Filled 2022-10-21: qty 10, 10d supply, fill #0

## 2022-11-10 ENCOUNTER — Other Ambulatory Visit (HOSPITAL_COMMUNITY): Payer: Self-pay

## 2022-11-10 MED ORDER — PHENTERMINE HCL 15 MG PO CAPS
15.0000 mg | ORAL_CAPSULE | Freq: Every day | ORAL | 0 refills | Status: DC
  Filled 2022-11-10 – 2022-11-18 (×2): qty 28, 28d supply, fill #0

## 2022-11-10 MED ORDER — TOPIRAMATE 25 MG PO TABS
25.0000 mg | ORAL_TABLET | Freq: Every day | ORAL | 1 refills | Status: DC
  Filled 2022-11-10: qty 30, 30d supply, fill #0
  Filled 2023-02-16: qty 30, 30d supply, fill #1

## 2022-11-15 ENCOUNTER — Other Ambulatory Visit (HOSPITAL_COMMUNITY): Payer: Self-pay

## 2022-11-18 ENCOUNTER — Other Ambulatory Visit (HOSPITAL_COMMUNITY): Payer: Self-pay

## 2022-12-06 ENCOUNTER — Other Ambulatory Visit: Payer: Self-pay | Admitting: Family Medicine

## 2022-12-06 DIAGNOSIS — Z1231 Encounter for screening mammogram for malignant neoplasm of breast: Secondary | ICD-10-CM

## 2022-12-09 ENCOUNTER — Other Ambulatory Visit (HOSPITAL_COMMUNITY): Payer: Self-pay

## 2022-12-09 MED ORDER — PHENTERMINE HCL 15 MG PO CAPS
15.0000 mg | ORAL_CAPSULE | Freq: Every day | ORAL | 0 refills | Status: DC
  Filled 2022-12-21: qty 28, 28d supply, fill #0

## 2022-12-09 MED ORDER — TOPIRAMATE 25 MG PO TABS
25.0000 mg | ORAL_TABLET | Freq: Every day | ORAL | 1 refills | Status: DC
  Filled 2022-12-09 – 2022-12-21 (×2): qty 30, 30d supply, fill #0

## 2022-12-21 ENCOUNTER — Other Ambulatory Visit (HOSPITAL_COMMUNITY): Payer: Self-pay

## 2023-01-06 ENCOUNTER — Other Ambulatory Visit (HOSPITAL_COMMUNITY): Payer: Self-pay

## 2023-01-06 MED ORDER — TOPIRAMATE 25 MG PO TABS
25.0000 mg | ORAL_TABLET | Freq: Every day | ORAL | 1 refills | Status: DC
  Filled 2023-01-06 – 2024-01-01 (×2): qty 90, 90d supply, fill #0

## 2023-01-06 MED ORDER — PHENTERMINE HCL 15 MG PO CAPS
15.0000 mg | ORAL_CAPSULE | Freq: Every day | ORAL | 0 refills | Status: DC
  Filled 2023-01-06 – 2023-01-15 (×2): qty 28, 28d supply, fill #0

## 2023-01-07 ENCOUNTER — Ambulatory Visit
Admission: RE | Admit: 2023-01-07 | Discharge: 2023-01-07 | Disposition: A | Discharge status: Home / Self Care | Payer: No Typology Code available for payment source | Source: Ambulatory Visit

## 2023-01-07 DIAGNOSIS — Z1231 Encounter for screening mammogram for malignant neoplasm of breast: Secondary | ICD-10-CM

## 2023-01-12 ENCOUNTER — Other Ambulatory Visit: Payer: Self-pay | Admitting: Family Medicine

## 2023-01-12 DIAGNOSIS — R928 Other abnormal and inconclusive findings on diagnostic imaging of breast: Secondary | ICD-10-CM

## 2023-01-17 ENCOUNTER — Other Ambulatory Visit (HOSPITAL_COMMUNITY): Payer: Self-pay

## 2023-01-17 ENCOUNTER — Other Ambulatory Visit: Payer: Self-pay

## 2023-01-24 ENCOUNTER — Other Ambulatory Visit: Payer: Self-pay

## 2023-01-24 MED ORDER — SULFAMETHOXAZOLE-TRIMETHOPRIM 800-160 MG PO TABS
1.0000 | ORAL_TABLET | Freq: Two times a day (BID) | ORAL | 0 refills | Status: DC
  Filled 2023-01-24: qty 10, 5d supply, fill #0

## 2023-01-27 ENCOUNTER — Ambulatory Visit
Admission: RE | Admit: 2023-01-27 | Discharge: 2023-01-27 | Disposition: A | Discharge status: Home / Self Care | Payer: No Typology Code available for payment source | Source: Ambulatory Visit | Attending: Family Medicine | Admitting: Family Medicine

## 2023-01-27 DIAGNOSIS — R928 Other abnormal and inconclusive findings on diagnostic imaging of breast: Secondary | ICD-10-CM

## 2023-02-08 ENCOUNTER — Other Ambulatory Visit (HOSPITAL_COMMUNITY): Payer: Self-pay

## 2023-02-08 MED ORDER — PHENTERMINE HCL 15 MG PO CAPS
15.0000 mg | ORAL_CAPSULE | Freq: Every day | ORAL | 0 refills | Status: DC
  Filled 2023-02-08 – 2023-02-16 (×2): qty 28, 28d supply, fill #0

## 2023-02-17 ENCOUNTER — Other Ambulatory Visit (HOSPITAL_COMMUNITY): Payer: Self-pay

## 2023-02-17 ENCOUNTER — Other Ambulatory Visit: Payer: Self-pay

## 2023-03-09 ENCOUNTER — Other Ambulatory Visit (HOSPITAL_COMMUNITY): Payer: Self-pay

## 2023-03-09 MED ORDER — TOPIRAMATE 25 MG PO TABS
25.0000 mg | ORAL_TABLET | Freq: Every day | ORAL | 1 refills | Status: DC
  Filled 2023-03-17: qty 90, 90d supply, fill #0
  Filled 2023-07-02: qty 90, 90d supply, fill #1

## 2023-03-09 MED ORDER — PHENTERMINE HCL 15 MG PO CAPS
15.0000 mg | ORAL_CAPSULE | Freq: Every day | ORAL | 0 refills | Status: DC
  Filled 2023-03-17: qty 28, 28d supply, fill #0

## 2023-03-17 ENCOUNTER — Other Ambulatory Visit (HOSPITAL_COMMUNITY): Payer: Self-pay

## 2023-03-17 ENCOUNTER — Other Ambulatory Visit: Payer: Self-pay

## 2023-03-18 ENCOUNTER — Other Ambulatory Visit (HOSPITAL_COMMUNITY): Payer: Self-pay

## 2023-04-08 ENCOUNTER — Other Ambulatory Visit (HOSPITAL_COMMUNITY): Payer: Self-pay

## 2023-04-11 ENCOUNTER — Other Ambulatory Visit (HOSPITAL_COMMUNITY): Payer: Self-pay

## 2023-04-11 MED ORDER — TRAZODONE HCL 50 MG PO TABS
50.0000 mg | ORAL_TABLET | Freq: Every evening | ORAL | 1 refills | Status: DC
  Filled 2023-04-11: qty 90, 30d supply, fill #0
  Filled 2023-07-02: qty 90, 30d supply, fill #1

## 2023-05-10 ENCOUNTER — Other Ambulatory Visit: Payer: Self-pay

## 2023-07-06 ENCOUNTER — Other Ambulatory Visit (HOSPITAL_COMMUNITY): Payer: Self-pay

## 2023-07-08 ENCOUNTER — Other Ambulatory Visit (HOSPITAL_COMMUNITY): Payer: Self-pay

## 2023-07-08 MED ORDER — VALACYCLOVIR HCL 1 G PO TABS
1000.0000 mg | ORAL_TABLET | Freq: Every day | ORAL | 2 refills | Status: AC | PRN
  Filled 2023-07-08: qty 10, 10d supply, fill #0
  Filled 2023-08-28: qty 10, 10d supply, fill #1
  Filled 2023-12-12: qty 10, 10d supply, fill #2

## 2023-07-11 ENCOUNTER — Other Ambulatory Visit (HOSPITAL_COMMUNITY): Payer: Self-pay

## 2023-07-18 ENCOUNTER — Other Ambulatory Visit (HOSPITAL_COMMUNITY): Payer: Self-pay

## 2023-07-18 MED ORDER — PHENTERMINE HCL 15 MG PO CAPS
15.0000 mg | ORAL_CAPSULE | Freq: Every day | ORAL | 0 refills | Status: DC
  Filled 2023-07-18: qty 30, 30d supply, fill #0

## 2023-08-10 ENCOUNTER — Other Ambulatory Visit (HOSPITAL_COMMUNITY): Payer: Self-pay

## 2023-08-10 MED ORDER — PHENTERMINE HCL 15 MG PO CAPS
15.0000 mg | ORAL_CAPSULE | Freq: Every day | ORAL | 1 refills | Status: DC
  Filled 2023-08-10 – 2023-08-14 (×2): qty 30, 30d supply, fill #0
  Filled 2023-09-14: qty 30, 30d supply, fill #1

## 2023-08-15 ENCOUNTER — Other Ambulatory Visit (HOSPITAL_COMMUNITY): Payer: Self-pay

## 2023-08-30 ENCOUNTER — Other Ambulatory Visit: Payer: Self-pay

## 2023-08-30 MED ORDER — NITROFURANTOIN MONOHYD MACRO 100 MG PO CAPS
100.0000 mg | ORAL_CAPSULE | Freq: Two times a day (BID) | ORAL | 0 refills | Status: DC
  Filled 2023-08-30: qty 10, 5d supply, fill #0

## 2023-09-15 ENCOUNTER — Other Ambulatory Visit: Payer: Self-pay

## 2023-09-22 ENCOUNTER — Other Ambulatory Visit (HOSPITAL_COMMUNITY): Payer: Self-pay

## 2023-09-22 ENCOUNTER — Other Ambulatory Visit: Payer: Self-pay

## 2023-09-22 MED ORDER — TOPIRAMATE 25 MG PO TABS
25.0000 mg | ORAL_TABLET | Freq: Every day | ORAL | 1 refills | Status: DC
  Filled 2023-09-22: qty 90, 90d supply, fill #0

## 2023-09-22 MED ORDER — PHENTERMINE HCL 15 MG PO CAPS
15.0000 mg | ORAL_CAPSULE | Freq: Every day | ORAL | 1 refills | Status: DC
  Filled 2023-10-17: qty 30, 30d supply, fill #0
  Filled 2023-12-12 – 2023-12-15 (×2): qty 30, 30d supply, fill #1

## 2023-09-26 ENCOUNTER — Other Ambulatory Visit (HOSPITAL_COMMUNITY): Payer: Self-pay

## 2023-09-27 ENCOUNTER — Other Ambulatory Visit (HOSPITAL_COMMUNITY): Payer: Self-pay

## 2023-09-27 MED ORDER — TRAZODONE HCL 50 MG PO TABS
50.0000 mg | ORAL_TABLET | Freq: Every day | ORAL | 1 refills | Status: AC
  Filled 2023-09-27: qty 90, 30d supply, fill #0
  Filled 2024-01-01: qty 90, 30d supply, fill #1

## 2023-09-28 ENCOUNTER — Other Ambulatory Visit (HOSPITAL_COMMUNITY): Payer: Self-pay

## 2023-10-05 ENCOUNTER — Other Ambulatory Visit (HOSPITAL_COMMUNITY): Payer: Self-pay

## 2023-10-17 ENCOUNTER — Other Ambulatory Visit (HOSPITAL_COMMUNITY): Payer: Self-pay

## 2023-10-17 ENCOUNTER — Other Ambulatory Visit: Payer: Self-pay

## 2023-10-25 ENCOUNTER — Other Ambulatory Visit: Payer: Self-pay

## 2023-10-25 MED ORDER — CIPROFLOXACIN HCL 500 MG PO TABS
500.0000 mg | ORAL_TABLET | Freq: Two times a day (BID) | ORAL | 0 refills | Status: DC
  Filled 2023-10-25: qty 14, 7d supply, fill #0

## 2023-11-16 ENCOUNTER — Other Ambulatory Visit (HOSPITAL_COMMUNITY): Payer: Self-pay

## 2023-11-16 MED ORDER — PHENTERMINE HCL 15 MG PO CAPS
15.0000 mg | ORAL_CAPSULE | Freq: Every day | ORAL | 1 refills | Status: DC
  Filled 2023-11-16: qty 30, 30d supply, fill #0

## 2023-12-12 ENCOUNTER — Other Ambulatory Visit (HOSPITAL_COMMUNITY): Payer: Self-pay

## 2023-12-13 ENCOUNTER — Other Ambulatory Visit (HOSPITAL_COMMUNITY): Payer: Self-pay

## 2023-12-13 MED ORDER — ROSUVASTATIN CALCIUM 5 MG PO TABS
5.0000 mg | ORAL_TABLET | Freq: Every day | ORAL | 3 refills | Status: AC
  Filled 2023-12-13: qty 90, 90d supply, fill #0
  Filled 2024-04-05: qty 90, 90d supply, fill #1
  Filled 2024-07-14: qty 90, 90d supply, fill #2
  Filled 2024-10-23: qty 90, 90d supply, fill #3

## 2023-12-15 ENCOUNTER — Other Ambulatory Visit: Payer: Self-pay

## 2024-01-02 ENCOUNTER — Other Ambulatory Visit: Payer: Self-pay | Admitting: Family Medicine

## 2024-01-02 ENCOUNTER — Other Ambulatory Visit (HOSPITAL_COMMUNITY): Payer: Self-pay

## 2024-01-02 DIAGNOSIS — Z1231 Encounter for screening mammogram for malignant neoplasm of breast: Secondary | ICD-10-CM

## 2024-01-11 ENCOUNTER — Other Ambulatory Visit (HOSPITAL_COMMUNITY): Payer: Self-pay

## 2024-01-11 MED ORDER — PHENTERMINE HCL 15 MG PO CAPS
15.0000 mg | ORAL_CAPSULE | Freq: Every day | ORAL | 1 refills | Status: DC
  Filled 2024-01-11 – 2024-01-13 (×2): qty 30, 30d supply, fill #0
  Filled 2024-02-19: qty 30, 30d supply, fill #1

## 2024-01-13 ENCOUNTER — Other Ambulatory Visit (HOSPITAL_COMMUNITY): Payer: Self-pay

## 2024-02-01 ENCOUNTER — Ambulatory Visit
Admission: RE | Admit: 2024-02-01 | Discharge: 2024-02-01 | Disposition: A | Discharge status: Home / Self Care | Payer: No Typology Code available for payment source | Source: Ambulatory Visit

## 2024-02-01 DIAGNOSIS — Z1231 Encounter for screening mammogram for malignant neoplasm of breast: Secondary | ICD-10-CM

## 2024-02-20 ENCOUNTER — Other Ambulatory Visit: Payer: Self-pay

## 2024-03-15 ENCOUNTER — Other Ambulatory Visit (HOSPITAL_COMMUNITY): Payer: Self-pay

## 2024-03-15 MED ORDER — TOPIRAMATE 25 MG PO TABS
25.0000 mg | ORAL_TABLET | Freq: Every day | ORAL | 1 refills | Status: DC
  Filled 2024-03-15: qty 90, 90d supply, fill #0
  Filled 2024-07-14: qty 90, 90d supply, fill #1

## 2024-03-15 MED ORDER — PHENTERMINE HCL 15 MG PO CAPS
15.0000 mg | ORAL_CAPSULE | Freq: Every day | ORAL | 1 refills | Status: DC
  Filled 2024-03-23: qty 30, 30d supply, fill #0
  Filled 2024-04-19 – 2024-04-20 (×2): qty 30, 30d supply, fill #1

## 2024-03-19 ENCOUNTER — Ambulatory Visit (INDEPENDENT_AMBULATORY_CARE_PROVIDER_SITE_OTHER): Admitting: Family Medicine

## 2024-03-19 VITALS — BP 110/78 | Ht 66.0 in | Wt 135.0 lb

## 2024-03-19 DIAGNOSIS — G8929 Other chronic pain: Secondary | ICD-10-CM

## 2024-03-19 DIAGNOSIS — M25562 Pain in left knee: Secondary | ICD-10-CM

## 2024-03-19 NOTE — Patient Instructions (Signed)
 You have a meniscus contusion, less likely a small meniscus tear. Start physical therapy and do home exercises on days you don't go to therapy. Icing 15 minutes at a time as needed. Aleve 2 tabs twice a day with food as needed for pain and inflammation. Avoid deep squats, lunges, leg press, plyometrics for now. Activities as tolerated - physical therapy will help ease you back into running. Follow up in 6 weeks.

## 2024-03-19 NOTE — Progress Notes (Addendum)
 PCP: Karlton Overly, DO  Subjective:   HPI: Patient is a 53 y.o. female here for left knee pain.  Occurred about 6 weeks ago when she was running on the treadmill and twisted her knee while looking at something over her shoulder.  Felt immediate pain, mild swelling and no bruising.  Has been taking meloxicam , icing and resting for the past 3 weeks with mild improvement.  Typically runs about 3 miles per day.  Reports now pain is with extended walking and present on lateral aspect of her knee.  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  Past Medical History:  Diagnosis Date   Arthritis    psoriatic arthritis   Atypical ductal hyperplasia of breast    right   Migraines, neuralgic     Current Outpatient Medications on File Prior to Visit  Medication Sig Dispense Refill   etanercept (ENBREL) 25 MG injection Inject 25 mg into the skin once a week.     levonorgestrel (MIRENA) 20 MCG/24HR IUD 1 each by Intrauterine route once.     meloxicam  (MOBIC ) 15 MG tablet Take 15 mg by mouth daily.     meloxicam  (MOBIC ) 15 MG tablet Take 1 tablet by mouth Once a day with food for pain and inflammation (Patient not taking: Reported on 03/19/2024) 90 tablet 1   nitrofurantoin , macrocrystal-monohydrate, (MACROBID ) 100 MG capsule Take 1 capsule (100 mg total) by mouth 2 (two) times daily. 10 capsule 0   oseltamivir  (TAMIFLU ) 75 MG capsule Take 1 capsule (75 mg total) by mouth daily for 10 days 10 capsule 0   phentermine  15 MG capsule Take 1 capsule (15 mg total) by mouth daily 30 minutes before a meal (Patient not taking: Reported on 03/19/2024) 28 capsule 0   phentermine  15 MG capsule Take 1 capsule (15 mg total) by mouth once daily. (Patient not taking: Reported on 03/19/2024) 30 capsule 1   phentermine  15 MG capsule Take 1 capsule (15 mg total) by mouth daily. (Patient not taking: Reported on 03/19/2024) 30 capsule 1   phentermine  15 MG capsule Take 1 capsule (15 mg total) by mouth  daily. (Patient not taking: Reported on 03/19/2024) 30 capsule 1   phentermine  15 MG capsule Take 1 capsule (15 mg total) by mouth daily. 30 capsule 1   Phentermine -Topiramate  (QSYMIA ) 3.75-23 MG CP24 Take 1 capsule by mouth daily for 14 days. 14 capsule 0   Phentermine -Topiramate  (QSYMIA ) 7.5-46 MG CP24 Take 1 capsule by mouth daily. 30 capsule 0   polyethylene glycol-electrolytes (NULYTELY) 420 g solution USE AS DIRECTED 4000 mL 0   rosuvastatin  (CRESTOR ) 5 MG tablet Take 1 tablet (5 mg total) by mouth daily. 90 tablet 3   sulfamethoxazole -trimethoprim  (BACTRIM  DS) 800-160 MG tablet Take 1 tablet by mouth 2 (two) times daily for 5 days 10 tablet 0   sulfamethoxazole -trimethoprim  (BACTRIM  DS) 800-160 MG tablet Take 1 tablet by mouth 2 (two) times daily. 10 tablet 0   tamoxifen  (NOLVADEX ) 20 MG tablet Take 1 tablet (20 mg total) by mouth daily. 90 tablet 4   topiramate  (TOPAMAX ) 25 MG tablet Take 1 tablet (25 mg total) by mouth daily. (Patient not taking: Reported on 03/19/2024) 30 tablet 1   topiramate  (TOPAMAX ) 25 MG tablet Take 1 tablet (25 mg total) by mouth daily. (Patient not taking: Reported on 03/19/2024) 30 tablet 1   topiramate  (TOPAMAX ) 25 MG tablet Take 1 tablet (25 mg total) by mouth daily. (Patient not taking: Reported on 03/19/2024) 30 tablet 1  topiramate  (TOPAMAX ) 25 MG tablet Take 1 tablet (25 mg total) by mouth daily. (Patient not taking: Reported on 03/19/2024) 30 tablet 1   topiramate  (TOPAMAX ) 25 MG tablet Take 1 tablet (25 mg total) by mouth daily. (Patient not taking: Reported on 03/19/2024) 90 tablet 1   topiramate  (TOPAMAX ) 25 MG tablet Take 1 tablet (25 mg total) by mouth daily. (Patient not taking: Reported on 03/19/2024) 90 tablet 1   topiramate  (TOPAMAX ) 25 MG tablet Take 1 tablet (25 mg total) by mouth daily. (Patient not taking: Reported on 03/19/2024) 90 tablet 1   topiramate  (TOPAMAX ) 25 MG tablet Take 1 tablet (25 mg total) by mouth daily. 90 tablet 1   traZODone   (DESYREL ) 50 MG tablet Take 50-150 mg by mouth at bedtime as needed. (Patient not taking: Reported on 03/19/2024)     traZODone  (DESYREL ) 50 MG tablet TAKE 1 TO 3 TABLETS BY MOUTH FOR SLEEP DISTURBANCE 30 90 tablet 2   traZODone  (DESYREL ) 50 MG tablet TAKE 1 TO 3 TABLETS BY MOUTH FOR SLEEP DISTURBANCE 30 90 tablet 2   traZODone  (DESYREL ) 50 MG tablet Take 1-3 tablets (50-150 mg total) by mouth at bedtime for sleep. (Patient not taking: Reported on 03/19/2024) 90 tablet 1   traZODone  (DESYREL ) 50 MG tablet Take 1-3 tablets (50-150 mg total) by mouth at bedtime for sleep (Patient not taking: Reported on 03/19/2024) 90 tablet 1   traZODone  (DESYREL ) 50 MG tablet Take 1-3 tablets (50-150 mg total) by mouth at bedtime for sleep. 90 tablet 1   valACYclovir  (VALTREX ) 1000 MG tablet  (Patient not taking: Reported on 03/19/2024)  5   valACYclovir  (VALTREX ) 1000 MG tablet Take 1 tablet (1,000 mg total) by mouth daily as needed. 10 tablet 2   No current facility-administered medications on file prior to visit.    Past Surgical History:  Procedure Laterality Date   BREAST BIOPSY Right 06/19/2012   BREAST EXCISIONAL BIOPSY Right 07/2012   BREAST LUMPECTOMY WITH RADIOACTIVE SEED LOCALIZATION Right 07/15/2015   Procedure: BREAST LUMPECTOMY WITH RADIOACTIVE SEED LOCALIZATION;  Surgeon: Boyce Byes, MD;  Location: [facility];  Service: General;  Laterality: Right;   KNEE ARTHROSCOPY Right    RHINOPLASTY      Allergies  Allergen Reactions   Codeine Nausea Only    BP 110/78   Ht 5\' 6"  (1.676 m)   Wt 135 lb (61.2 kg)   BMI 21.79 kg/m       No data to display              No data to display              Objective:  Physical Exam:  Gen: NAD, comfortable in exam room MSK: Left knee: -No erythema, ecchymosis or deformity -TTP over lateral joint line -5/5 hip flexion and knee flexion/extension bilaterally -Negative varus, valgus, anterior and posterior drawer -Negative  McMurray -Positive Thessaly -Normal gait  Bedside US , left knee: No fluid noted in suprapatellar pouch.  No defect noted in the anterior and posterior lateral meniscus.   Assessment & Plan:  1.  Left knee pain: Likely secondary to lateral meniscus contusion, no tear visualized on US .  Will continue supportive care with NSAIDs and icing and refer for formal PT.  Icing, aleve as needed.  Avoid exacerbating activities and follow-up in 6 weeks.

## 2024-03-22 ENCOUNTER — Other Ambulatory Visit (HOSPITAL_BASED_OUTPATIENT_CLINIC_OR_DEPARTMENT_OTHER): Payer: Self-pay

## 2024-03-23 ENCOUNTER — Other Ambulatory Visit (HOSPITAL_COMMUNITY): Payer: Self-pay

## 2024-04-05 ENCOUNTER — Other Ambulatory Visit (HOSPITAL_BASED_OUTPATIENT_CLINIC_OR_DEPARTMENT_OTHER): Payer: Self-pay

## 2024-04-16 ENCOUNTER — Other Ambulatory Visit (HOSPITAL_COMMUNITY): Payer: Self-pay

## 2024-04-16 MED ORDER — TRAZODONE HCL 50 MG PO TABS
50.0000 mg | ORAL_TABLET | Freq: Every day | ORAL | 3 refills | Status: AC
  Filled 2024-04-16: qty 180, 90d supply, fill #0
  Filled 2024-10-10: qty 180, 90d supply, fill #1

## 2024-04-19 ENCOUNTER — Other Ambulatory Visit (HOSPITAL_COMMUNITY): Payer: Self-pay

## 2024-04-19 ENCOUNTER — Other Ambulatory Visit: Payer: Self-pay

## 2024-05-10 ENCOUNTER — Encounter: Payer: Self-pay | Admitting: Family Medicine

## 2024-05-10 ENCOUNTER — Other Ambulatory Visit: Payer: Self-pay

## 2024-05-10 DIAGNOSIS — G8929 Other chronic pain: Secondary | ICD-10-CM

## 2024-05-17 ENCOUNTER — Other Ambulatory Visit (HOSPITAL_COMMUNITY): Payer: Self-pay

## 2024-05-17 MED ORDER — PHENTERMINE HCL 15 MG PO CAPS
15.0000 mg | ORAL_CAPSULE | Freq: Every day | ORAL | 1 refills | Status: DC
  Filled 2024-05-17 – 2024-05-23 (×2): qty 30, 30d supply, fill #0
  Filled 2024-06-28: qty 30, 30d supply, fill #1

## 2024-05-23 ENCOUNTER — Other Ambulatory Visit (HOSPITAL_COMMUNITY): Payer: Self-pay

## 2024-05-25 ENCOUNTER — Other Ambulatory Visit (HOSPITAL_COMMUNITY): Payer: Self-pay

## 2024-05-25 MED ORDER — MELOXICAM 15 MG PO TABS
15.0000 mg | ORAL_TABLET | Freq: Every day | ORAL | 0 refills | Status: AC | PRN
  Filled 2024-05-25: qty 30, 30d supply, fill #0

## 2024-06-29 ENCOUNTER — Other Ambulatory Visit: Payer: Self-pay

## 2024-07-18 ENCOUNTER — Other Ambulatory Visit (HOSPITAL_COMMUNITY): Payer: Self-pay

## 2024-07-18 MED ORDER — PHENTERMINE HCL 15 MG PO CAPS
15.0000 mg | ORAL_CAPSULE | Freq: Every day | ORAL | 1 refills | Status: AC
  Filled 2024-08-03: qty 30, 30d supply, fill #0
  Filled 2024-10-23: qty 30, 30d supply, fill #1

## 2024-07-18 MED ORDER — TOPIRAMATE ER 25 MG PO CAP24
25.0000 mg | ORAL_CAPSULE | Freq: Two times a day (BID) | ORAL | 1 refills | Status: AC
  Filled 2024-07-18: qty 60, 30d supply, fill #0

## 2024-07-19 ENCOUNTER — Other Ambulatory Visit (HOSPITAL_COMMUNITY): Payer: Self-pay

## 2024-07-23 ENCOUNTER — Other Ambulatory Visit (HOSPITAL_COMMUNITY): Payer: Self-pay

## 2024-07-25 ENCOUNTER — Other Ambulatory Visit (HOSPITAL_COMMUNITY): Payer: Self-pay

## 2024-07-31 ENCOUNTER — Other Ambulatory Visit (HOSPITAL_COMMUNITY): Payer: Self-pay

## 2024-08-03 ENCOUNTER — Other Ambulatory Visit (HOSPITAL_COMMUNITY): Payer: Self-pay

## 2024-08-03 ENCOUNTER — Encounter (HOSPITAL_COMMUNITY): Payer: Self-pay

## 2024-08-03 ENCOUNTER — Other Ambulatory Visit: Payer: Self-pay

## 2024-08-08 ENCOUNTER — Other Ambulatory Visit (HOSPITAL_COMMUNITY): Payer: Self-pay

## 2024-08-08 MED ORDER — CELECOXIB 200 MG PO CAPS
200.0000 mg | ORAL_CAPSULE | Freq: Two times a day (BID) | ORAL | 3 refills | Status: AC | PRN
  Filled 2024-08-08: qty 60, 30d supply, fill #0
  Filled 2024-09-23: qty 60, 30d supply, fill #1
  Filled 2024-11-29: qty 60, 30d supply, fill #2

## 2024-09-06 ENCOUNTER — Other Ambulatory Visit (HOSPITAL_COMMUNITY): Payer: Self-pay

## 2024-09-06 MED ORDER — PHENTERMINE HCL 37.5 MG PO TABS
18.7500 mg | ORAL_TABLET | Freq: Every morning | ORAL | 1 refills | Status: AC
  Filled 2024-09-06: qty 30, 30d supply, fill #0

## 2024-09-24 ENCOUNTER — Other Ambulatory Visit (HOSPITAL_COMMUNITY): Payer: Self-pay

## 2024-09-24 MED ORDER — VALACYCLOVIR HCL 1 G PO TABS
2.0000 g | ORAL_TABLET | Freq: Every day | ORAL | 2 refills | Status: AC
  Filled 2024-09-24: qty 12, 6d supply, fill #0

## 2024-10-19 ENCOUNTER — Other Ambulatory Visit (HOSPITAL_COMMUNITY): Payer: Self-pay

## 2024-10-19 MED ORDER — TOPIRAMATE 25 MG PO TABS
25.0000 mg | ORAL_TABLET | Freq: Every day | ORAL | 1 refills | Status: AC
  Filled 2024-10-19: qty 90, 90d supply, fill #0

## 2024-10-22 ENCOUNTER — Other Ambulatory Visit (HOSPITAL_COMMUNITY): Payer: Self-pay

## 2024-10-23 ENCOUNTER — Other Ambulatory Visit (HOSPITAL_COMMUNITY): Payer: Self-pay

## 2024-10-23 MED ORDER — PREDNISONE 5 MG PO TABS
ORAL_TABLET | ORAL | 0 refills | Status: AC
  Filled 2024-10-23: qty 48, 12d supply, fill #0

## 2024-10-26 ENCOUNTER — Other Ambulatory Visit (HOSPITAL_COMMUNITY): Payer: Self-pay

## 2024-10-26 MED ORDER — CIPROFLOXACIN HCL 500 MG PO TABS
500.0000 mg | ORAL_TABLET | Freq: Two times a day (BID) | ORAL | 0 refills | Status: AC
  Filled 2024-10-26: qty 14, 7d supply, fill #0

## 2024-11-13 ENCOUNTER — Other Ambulatory Visit (HOSPITAL_COMMUNITY): Payer: Self-pay

## 2024-11-13 MED ORDER — BUPROPION HCL ER (SR) 150 MG PO TB12
150.0000 mg | ORAL_TABLET | Freq: Every morning | ORAL | 1 refills | Status: AC
  Filled 2024-11-13: qty 30, 30d supply, fill #0

## 2024-11-20 ENCOUNTER — Other Ambulatory Visit (HOSPITAL_COMMUNITY): Payer: Self-pay
